# Patient Record
Sex: Male | Born: 1950 | Race: Black or African American | Hispanic: No | Marital: Single | Smoking: Never smoker
Health system: Southern US, Community
[De-identification: ages and names within clinical notes are randomized; demographics above are authoritative.]

## PROBLEM LIST (undated history)

## (undated) DIAGNOSIS — C801 Malignant (primary) neoplasm, unspecified: Secondary | ICD-10-CM

## (undated) DIAGNOSIS — Z7689 Persons encountering health services in other specified circumstances: Secondary | ICD-10-CM

## (undated) DIAGNOSIS — G4730 Sleep apnea, unspecified: Secondary | ICD-10-CM

## (undated) DIAGNOSIS — C61 Malignant neoplasm of prostate: Secondary | ICD-10-CM

## (undated) DIAGNOSIS — H269 Unspecified cataract: Secondary | ICD-10-CM

## (undated) HISTORY — DX: Malignant neoplasm of prostate: C61

## (undated) HISTORY — PX: OTHER SURGICAL HISTORY: SHX169

## (undated) HISTORY — DX: Unspecified cataract: H26.9

## (undated) HISTORY — DX: Persons encountering health services in other specified circumstances: Z76.89

---

## 2002-12-26 ENCOUNTER — Encounter (INDEPENDENT_AMBULATORY_CARE_PROVIDER_SITE_OTHER): Payer: Self-pay | Admitting: *Deleted

## 2002-12-26 ENCOUNTER — Ambulatory Visit (HOSPITAL_COMMUNITY): Admission: RE | Admit: 2002-12-26 | Discharge: 2002-12-26 | Payer: Self-pay | Admitting: Gastroenterology

## 2006-05-11 ENCOUNTER — Encounter: Admission: RE | Admit: 2006-05-11 | Discharge: 2006-05-11 | Payer: Self-pay | Admitting: Internal Medicine

## 2007-12-10 ENCOUNTER — Emergency Department (HOSPITAL_COMMUNITY): Admission: EM | Admit: 2007-12-10 | Discharge: 2007-12-10 | Payer: Self-pay | Admitting: Emergency Medicine

## 2009-01-22 ENCOUNTER — Encounter: Admission: RE | Admit: 2009-01-22 | Discharge: 2009-01-22 | Payer: Self-pay | Admitting: Sports Medicine

## 2010-07-22 NOTE — Op Note (Signed)
Tyrone Kline, Tyrone Kline                       ACCOUNT NO.:  [id number]   MEDICAL RECORD NO.:  [id number]                   PATIENT TYPE:  AMB   LOCATION:  ENDO                                 FACILITY:  [facility]   PHYSICIAN:  Anselmo Rod, M.D.               DATE OF BIRTH:  [birth year]   DATE OF PROCEDURE:  12/26/2002  DATE OF DISCHARGE:                                 OPERATIVE REPORT   PROCEDURE PERFORMED:  Colonoscopy with snare polypectomy x 3 and core biopsy  x 1.   ENDOSCOPIST:  Anselmo Rod, M.D.   INSTRUMENT USED:  Olympus video colonoscope.   INDICATION FOR PROCEDURE:  A 59 year old [place] American male with an  unknown family history undergoing screening colonoscopy to rule out colonic  polyps, masses, etc.   PREPROCEDURE PREPARATION:  Informed consent was procured from the patient.  The patient fasted for eight hours prior to the procedure and was prepped  with a bottle of magnesium citrate and a gallon of GoLYTELY the night prior  to the procedure.   PREPROCEDURE PHYSICAL EXAMINATION:  VITAL SIGNS:  Stable.  NECK:  Supple.  CHEST:  Clear to auscultation.  HEART:  S1 and S2 regular.  ABDOMEN:  Soft with normal bowel sounds.   DESCRIPTION OF PROCEDURE:  The patient was placed in the left lateral  decubitus position and sedated with 70 mg of Demerol and 7 mg of Versed in  slow incremental doses.  Once the patient was adequately sedated and  maintained on low-flow oxygen and continuous cardiac monitoring, the Olympus  video colonoscope was advanced from the rectum to the cecum and terminal  ileum.  The patient had a fairly good prep.  Three polyps were snared from  the right colon, one from the proximal right colon, one from the mid right  colon, and one from the distal right colon.  These were all placed in the  same bottle.  A small sessile polyp was biopsied from the proximal right  colon as well.  The appendicular orifices and ileocecal valve were clearly  visualized and photographed.  The terminal ileum appeared normal and without  lesions.  There were a few sigmoid diverticula seen.  Retroflexion in the  rectum revealed no abnormalities.  The patient tolerated the procedure well  without complications.   IMPRESSION:  1. Multiple right-sided colonic polyps removed by snare polypectomy and cold     biopsy (see description above).  2. Sigmoid diverticulosis.    RECOMMENDATIONS:  1. Await pathology results.  2. Avoid all nonsteroidals, including aspirin, for the next four weeks.  3. Outpatient followup in the next two weeks for further recommendations.  Anselmo Rod, M.D.    JNM/MEDQ  D:  12/26/2002  T:  12/26/2002  Job:  [id number]   cc:   Gabriel Earing, M.D.  [address]  [place]  [place] [address]  Fax: [phone]

## 2011-08-18 ENCOUNTER — Other Ambulatory Visit (HOSPITAL_COMMUNITY): Payer: Self-pay | Admitting: Urology

## 2011-08-18 DIAGNOSIS — C61 Malignant neoplasm of prostate: Secondary | ICD-10-CM

## 2011-08-29 ENCOUNTER — Encounter (HOSPITAL_COMMUNITY)
Admission: RE | Admit: 2011-08-29 | Discharge: 2011-08-29 | Disposition: A | Discharge status: Home / Self Care | Payer: 59 | Source: Ambulatory Visit | Attending: Urology | Admitting: Urology

## 2011-08-29 ENCOUNTER — Ambulatory Visit (HOSPITAL_COMMUNITY)
Admission: RE | Admit: 2011-08-29 | Discharge: 2011-08-29 | Disposition: A | Discharge status: Home / Self Care | Payer: 59 | Source: Ambulatory Visit | Attending: Urology | Admitting: Urology

## 2011-08-29 DIAGNOSIS — C61 Malignant neoplasm of prostate: Secondary | ICD-10-CM | POA: Insufficient documentation

## 2011-08-29 MED ORDER — TECHNETIUM TC 99M MEDRONATE IV KIT
25.0000 | PACK | Freq: Once | INTRAVENOUS | Status: AC | PRN
  Administered 2011-08-29: 25 via INTRAVENOUS

## 2011-09-04 ENCOUNTER — Other Ambulatory Visit (HOSPITAL_COMMUNITY): Payer: Self-pay | Admitting: Urology

## 2011-09-04 DIAGNOSIS — C61 Malignant neoplasm of prostate: Secondary | ICD-10-CM

## 2011-09-06 ENCOUNTER — Ambulatory Visit (HOSPITAL_COMMUNITY)
Admission: RE | Admit: 2011-09-06 | Discharge: 2011-09-06 | Disposition: A | Discharge status: Home / Self Care | Payer: 59 | Source: Ambulatory Visit | Attending: Urology | Admitting: Urology

## 2011-09-06 DIAGNOSIS — C61 Malignant neoplasm of prostate: Secondary | ICD-10-CM

## 2011-09-06 LAB — CREATININE, SERUM
Creatinine, Ser: 1.22 mg/dL (ref 0.50–1.35)
GFR calc Af Amer: 71 mL/min — ABNORMAL LOW (ref >90)
GFR calc non Af Amer: 61 mL/min — ABNORMAL LOW (ref >90)

## 2011-09-06 MED ORDER — GADOBENATE DIMEGLUMINE 529 MG/ML IV SOLN
20.0000 mL | Freq: Once | INTRAVENOUS | Status: AC | PRN
  Administered 2011-09-06: 20 mL via INTRAVENOUS

## 2011-09-27 ENCOUNTER — Other Ambulatory Visit: Payer: Self-pay | Admitting: Urology

## 2011-09-28 ENCOUNTER — Encounter (HOSPITAL_COMMUNITY): Payer: Self-pay | Admitting: Pharmacy Technician

## 2011-10-05 ENCOUNTER — Inpatient Hospital Stay (HOSPITAL_COMMUNITY): Admission: RE | Admit: 2011-10-05 | Discharge: 2011-10-05 | Payer: 59 | Source: Ambulatory Visit

## 2011-10-05 NOTE — Pre-Procedure Instructions (Signed)
Lab results from 05/23/2011 from Dr. Concepcion Elk on chart.

## 2011-10-06 ENCOUNTER — Encounter (HOSPITAL_COMMUNITY)
Admission: RE | Admit: 2011-10-06 | Discharge: 2011-10-06 | Disposition: A | Discharge status: Home / Self Care | Payer: 59 | Source: Ambulatory Visit | Attending: Urology | Admitting: Urology

## 2011-10-06 ENCOUNTER — Encounter (HOSPITAL_COMMUNITY): Payer: Self-pay

## 2011-10-06 DIAGNOSIS — G4730 Sleep apnea, unspecified: Secondary | ICD-10-CM

## 2011-10-06 HISTORY — DX: Sleep apnea, unspecified: G47.30

## 2011-10-06 HISTORY — DX: Malignant (primary) neoplasm, unspecified: C80.1

## 2011-10-06 LAB — CBC
HCT: 44.4 % (ref 39.0–52.0)
Hemoglobin: 15.2 g/dL (ref 13.0–17.0)
MCH: 30.8 pg (ref 26.0–34.0)
MCHC: 34.2 g/dL (ref 30.0–36.0)
MCV: 89.9 fL (ref 78.0–100.0)
Platelets: 176 10*3/uL (ref 150–400)
RBC: 4.94 MIL/uL (ref 4.22–5.81)
RDW: 13.8 % (ref 11.5–15.5)
WBC: 5.8 10*3/uL (ref 4.0–10.5)

## 2011-10-06 LAB — BASIC METABOLIC PANEL
BUN: 13 mg/dL (ref 6–23)
CO2: 29 mEq/L (ref 19–32)
Calcium: 9.4 mg/dL (ref 8.4–10.5)
Chloride: 105 mEq/L (ref 96–112)
Creatinine, Ser: 1.09 mg/dL (ref 0.50–1.35)
GFR calc Af Amer: 82 mL/min — ABNORMAL LOW (ref >90)
GFR calc non Af Amer: 70 mL/min — ABNORMAL LOW (ref >90)
Glucose, Bld: 86 mg/dL (ref 70–99)
Potassium: 3.9 mEq/L (ref 3.5–5.1)
Sodium: 140 mEq/L (ref 135–145)

## 2011-10-06 LAB — SURGICAL PCR SCREEN
MRSA, PCR: POSITIVE — AB
Staphylococcus aureus: POSITIVE — AB

## 2011-10-06 NOTE — Pre-Procedure Instructions (Signed)
CBC, BMET WERE DONE TODAY - PREOP AT [facility].  T/S TO BE DRAWN DAY OF SURGERY. PT STATED EKG WAS DONE THIS YEAR BY HIS MEDICAL DOCTOR-DR. AVBUERE.  REPORT CALLED FOR--OFFICE STAFF CONFIRMED THAT EKG WAS DONE IN [facility] 2013--REPORT WILL BE FAXED TO PRESURGICAL TESTING AT [facility]. CXR NOT NEEDED PER ANESTHESIOLOGIST'S GUIDELINES. PREOP INSTRUCTIONS DISCUSSED WITH PT USING TEACH BACK METHOD.

## 2011-10-06 NOTE — Patient Instructions (Signed)
YOUR SURGERY IS SCHEDULED ON:  WED  8/7  AT 8:30 AM  REPORT TO [facility] AT: 6:30 AM      PHONE # FOR SHORT STAY IS [phone]  FOLLOW CLEAR LIQUID DIET AND BOWEL PREP INSTRUCTIONS FROM DR. GRAPEY'S OFFICE - THE DAY BEFORE YOUR SURGERY.  DO NOT EAT OR DRINK ANYTHING AFTER MIDNIGHT THE NIGHT BEFORE YOUR SURGERY.  YOU MAY BRUSH YOUR TEETH, RINSE OUT YOUR MOUTH--BUT NO WATER, NO FOOD, NO CHEWING GUM, NO MINTS, NO CANDIES, NO CHEWING TOBACCO.  PLEASE TAKE THE FOLLOWING MEDICATIONS THE AM OF YOUR SURGERY WITH A FEW SIPS OF WATER:  NO MEDICINES TO TAKE    IF YOU USE INHALERS--USE YOUR INHALERS THE AM OF YOUR SURGERY AND BRING INHALERS TO THE HOSPITAL -TAKE TO SURGERY.    IF YOU ARE DIABETIC:  DO NOT TAKE ANY DIABETIC MEDICATIONS THE AM OF YOUR SURGERY.  IF YOU TAKE INSULIN IN THE EVENINGS--PLEASE ONLY TAKE 1/2 NORMAL EVENING DOSE THE NIGHT BEFORE YOUR SURGERY.  NO INSULIN THE AM OF YOUR SURGERY.  IF YOU HAVE SLEEP APNEA AND USE CPAP OR BIPAP--PLEASE BRING THE MASK --NOT THE MACHINE-NOT THE TUBING   -JUST THE MASK. DO NOT BRING VALUABLES, MONEY, CREDIT CARDS.  CONTACT LENS, DENTURES / PARTIALS, GLASSES SHOULD NOT BE WORN TO SURGERY AND IN MOST CASES-HEARING AIDS WILL NEED TO BE REMOVED.  BRING YOUR GLASSES CASE, ANY EQUIPMENT NEEDED FOR YOUR CONTACT LENS. FOR PATIENTS ADMITTED TO THE HOSPITAL--CHECK OUT TIME THE DAY OF DISCHARGE IS 11:00 AM.  ALL INPATIENT ROOMS ARE PRIVATE - WITH BATHROOM, TELEPHONE, TELEVISION AND WIFI INTERNET. IF YOU ARE BEING DISCHARGED THE SAME DAY OF YOUR SURGERY--YOU CAN NOT DRIVE YOURSELF HOME--AND SHOULD NOT GO HOME ALONE BY TAXI OR BUS.  NO DRIVING OR OPERATING MACHINERY FOR 24 HOURS FOLLOWING ANESTHESIA / PAIN MEDICATIONS.                            SPECIAL INSTRUCTIONS:  CHLORHEXIDINE SOAP SHOWER (other brand names are Betasept and Hibiclens ) PLEASE SHOWER WITH CHLORHEXIDINE THE NIGHT BEFORE YOUR SURGERY AND THE AM OF YOUR SURGERY. DO NOT USE  CHLORHEXIDINE ON YOUR FACE OR PRIVATE AREAS--YOU MAY USE YOUR NORMAL SOAP THOSE AREAS AND YOUR NORMAL SHAMPOO.  WOMEN SHOULD AVOID SHAVING UNDER ARMS AND SHAVING LEGS 48 HOURS BEFORE USING CHLORHEXIDINE TO AVOID SKIN IRRITATION.  DO NOT USE IF ALLERGIC TO CHLORHEXIDINE.  PLEASE READ OVER ANY  FACT SHEETS THAT YOU WERE GIVEN: MRSA INFORMATION, BLOOD TRANSFUSION INFORMATION

## 2011-10-10 NOTE — Pre-Procedure Instructions (Signed)
EKG REPORT FROM DR. AVBUERE'S OFFICE REQUESTED AGAIN --OFFICE AWARE PT'S SURGERY TOMORROW.  EKG IS NOT REQUIRED PREOP--PT DOES NOT HAVE ANY KNOWN CARDIOVASCULAR PROBLEMS--BUT ATTEMPTS MADE TO OBTAIN THE EKG FOR PT'S MEDICAL RECORD/SURGERY.  AS OF 4 PM THE EKG NOT FAXED-CHART TAKEN TO SHORT STAY CENTER.

## 2011-10-10 NOTE — H&P (Signed)
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 Again, I have reviewed his information along with his pathology and office notes from Dr. Brunilda Payor. The patient has high volume disease with 12 out of 12 positive biopsies. The majority of the cancer is Gleason 7 but some primary Gleason 4 component. MRI did not show any definitive evidence of disease outside the prostate but obviously microscopically it is likely. Based on the [facility] Tables, his chance of organ confined disease is about 1 out of 3. He has about a 70+ percent chance of having extracapsular extension with a 40% chance of seminal vesicle involvement and a 6% chance of having positive lymph nodes. The patient's preoperative SHIM score is 15/25. He is a candidate only for wide excision bilaterally without any nerve-spare. He has had no intraabdominal surgery.      History of Present Illness     Past Hx from Dr. Brunilda Payor:   Mr Tyrone Kline's biopsy is positive for adenocarcinoma of prostate Gleason 4+3, 3+4 and 3+3.  All cores are positive in 30 to 90% of the cores.  His PSA is 10.49.  I spenttwenty five minutes in discussion with Mr Tyrone Kline and his wife.  They understand that his prognosis depends on the Gleason score, the PSA and the volume of the disease.  He has high volume disease and Gleason 4+3.  His PSA is 10.49.  I think he has high risk disease.  It is likely that he has extraprostatic disease.  According to the [facility] table he has 7% chance of having organ confined disease, 43% risk of extraprostatic extension, 10% of seminal vesicle involvement and 38% risks of lymph node involvement.  I discussed the treatment options: active surveillance, Radical prostatectomy, IMRT, brachytherapy, HIFU, cryoablation.  The risks, benefits of each option were discussed in detail.  I advised him againsyt active surveillance.  H eneeds bone scan  to rule bony metastasis.  If bone scan is negative will request CT scan or MRI to rule out pelvic or retroperitoneal adenopathy.       Mr Desire returns to the office today with his wife for bone scan and MRI results and discuss further management of his prostate cancer.  Bone scan showed no evidence of metastatic disease.  MRI shows 2 small right obturator and 1 small left obturator nodes. The ones on the right measure less than 1 cm, the left one measures 14 mm.  I went again over the treatment options with Mr and Mrs Tyrone Kline.  The risks, benefits of each option were explained in detail.  Mr Tyrone Kline states that he is interested in radical prostatectomy.  He understands that there is a risk of lymph node involvement.  I answered all their questions to their satisfaction.  I will ask one of my robotic partners to see him in consultation.   Past Medical History Problems  1. History of  Glaucoma 365.9  Surgical History Problems  1. History of  Cataract Surgery Bilateral 2. History of  Hand Surgery Right  Current Meds 1. Fish Oil CAPS; Therapy: (Recorded:23Jul2013) to 2. Levitra 20 MG Oral Tablet; Therapy: 12Apr2013 to 3. Multi-Vitamin TABS; Therapy: (Recorded:02May2013) to 4. Vitamin B-12 TABS; Therapy: (Recorded:23Jul2013) to 5. Vitamin C TABS; Therapy: (Recorded:23Jul2013) to 6. Vitamin D TABS; Therapy: (Recorded:23Jul2013) to  Allergies Medication  1. No Known Drug Allergies  Family History Problems  1. Sororal history of  Diabetes Mellitus  V18.0 2. Fraternal history of  Father Deceased At Age ____ 3. Family history of  Heart Disease V17.49 4. Sororal history of  Hypertension V17.49 5. Family history of  Kidney Cancer V16.51 6. Fraternal history of  Mother Deceased At Age ____ 7. Fraternal history of  Nephrolithiasis 8. Fraternal history of  Renal Failure  Social History Problems  1. Caffeine Use few per month 2. Marital History - Currently Married 3. 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 History of  Alcohol Use 6. History of  Tobacco Use  Review of Systems Genitourinary, constitutional, skin, eye, otolaryngeal, hematologic/lymphatic, cardiovascular, pulmonary, endocrine, musculoskeletal, gastrointestinal, neurological and psychiatric system(s) were reviewed and pertinent findings if present are noted.  Genitourinary: erectile dysfunction.  Psychiatric: depression.    Vitals Vital Signs [Data Includes: Last 1 Day]  23Jul2013 03:27PM  Blood Pressure: 130 / 77 Temperature: 97 F Heart Rate: 95  Physical Exam Constitutional: Well nourished and well developed . No acute distress.  ENT:. The ears and nose are normal in appearance.  Neck: The appearance of the neck is normal and no neck mass is present.  Pulmonary: No respiratory distress and normal respiratory rhythm and effort.  Cardiovascular: Heart rate and rhythm are normal . No peripheral edema.  Abdomen: The abdomen is soft and nontender. No masses are palpated. No CVA tenderness. No hernias are palpable. No hepatosplenomegaly noted.  Genitourinary: Examination of the penis demonstrates no discharge, no masses, no lesions and a normal meatus. The scrotum is without lesions. The right epididymis is palpably normal and non-tender. The left epididymis is palpably normal and non-tender. The right testis is non-tender and without masses. The left testis is non-tender and without masses.  Skin: Normal skin turgor, no visible rash and no visible skin lesions.    Assessment Assessed  1. Adenocarcinoma Of The Prostate Gland 185  Discussion/Summary  A total of 50 minutes were spent in the overall care of the patient today with 50 minutes in direct face to face consultation.   The patient was counseled about the natural history of prostate cancer and the standard treatment options that are available for prostate cancer. It was explained to him how his age and life expectancy, clinical stage,  Gleason score, and PSA affect his prognosis, the decision to proceed with additional staging studies, as well as how that information influences recommended treatment strategies. We discussed the roles for active surveillance, radiation therapy, surgical therapy, androgen deprivation, as well as ablative therapy options for the treatment of prostate cancer as appropriate to his individual cancer situation. We discussed the risks and benefits of these options with regard to their impact on cancer control and also in terms of potential adverse events, complications, and impact on quiality of life particularly related to urinary, bowel, and sexual function. The patient was encouraged to ask questions throughout the discussion today and all questions were answered to his stated satisfaction. In addition, the patient was provided with and/or directed to appropriate resources and literature for further education about prostate cancer and treatment options.   We discussed surgical therapy for prostate cancer including the different available surgical approaches. We discussed, in detail, the risks and expectations of surgery with regard to cancer control, urinary control, and erectile function as well as the expected postoperative recovery process. The risks, potential complications/adverse events of radical prostatectomy as well as alternative options were explained to the patient.   We discussed surgical therapy for prostate cancer including the different available surgical  approaches. We discussed, in detail, the risks and expectations of surgery with regard to cancer control, urinary control, and erectile function as well as the expected postoperative recovery process. Additional risks of surgery including but not limited to bleeding, infection, hernia formation, nerve damage, lymphocele formation, bowel/rectal injury potentially necessitating colostomy, damage to the urinary tract resulting in urine leakage, urethral  stricture, and the cardiopulmonary risks such as myocardial infarction, stroke, death, venothromboembolism, etc. were explained. The risk of open surgical conversion for robotic/laparoscopic prostatectomy was also discussed.    Amendment  Ultimately I do think Mr. Helmuth would be best served with a surgical approach. I think this would provide much more information with regard to pathologic staging. He and his wife understand that it is highly unlikely that this procedure itself will be curative, although there is potentially a 30% chance that we may indeed find only organ confined disease and no other therapy may be necessary. If he does have evidence of significant extension beyond the prostate, then radiation will need to be considered after an appropriate healing time. If indeed he does have metastatic disease, then certainly hormonal therapy will need to be initiated. He understands that he should have wide excision of the neurovascular bundles and will have erectile dysfunction. He could be potentially managed with penile injection therapy, vacuum device or potentially implant. He will not generally have much of a chance in responding to oral agents. We talked about incontinence issues. Again, I do think this is the best way to proceed. We will attempt to try to get him on the schedule for sometime in the next few weeks. It has now been 6-8 weeks since his biopsy.   CC: Su Grand, MD  Fleet Contras, MD      Signatures Electronically signed by : Barron Alvine, M.D.; Sep 27 2011 12:40PM

## 2011-10-11 ENCOUNTER — Encounter (HOSPITAL_COMMUNITY)
Admission: RE | Disposition: A | Discharge status: Home / Self Care | Payer: Self-pay | Source: Ambulatory Visit | Attending: Urology

## 2011-10-11 ENCOUNTER — Ambulatory Visit (HOSPITAL_COMMUNITY): Payer: 59 | Admitting: Anesthesiology

## 2011-10-11 ENCOUNTER — Encounter (HOSPITAL_COMMUNITY): Payer: Self-pay | Admitting: *Deleted

## 2011-10-11 ENCOUNTER — Encounter (HOSPITAL_COMMUNITY): Payer: Self-pay | Admitting: Anesthesiology

## 2011-10-11 ENCOUNTER — Inpatient Hospital Stay (HOSPITAL_COMMUNITY)
Admission: RE | Admit: 2011-10-11 | Discharge: 2011-10-12 | DRG: 708 | Disposition: A | Discharge status: Home / Self Care | Payer: 59 | Source: Ambulatory Visit | Attending: Urology | Admitting: Urology

## 2011-10-11 DIAGNOSIS — H409 Unspecified glaucoma: Secondary | ICD-10-CM | POA: Diagnosis present

## 2011-10-11 DIAGNOSIS — C61 Malignant neoplasm of prostate: Principal | ICD-10-CM | POA: Diagnosis present

## 2011-10-11 HISTORY — PX: ROBOT ASSISTED LAPAROSCOPIC RADICAL PROSTATECTOMY: SHX5141

## 2011-10-11 LAB — ABO/RH: ABO/RH(D): O POS

## 2011-10-11 LAB — TYPE AND SCREEN
ABO/RH(D): O POS
Antibody Screen: NEGATIVE

## 2011-10-11 LAB — HEMOGLOBIN AND HEMATOCRIT, BLOOD
HCT: 44.5 % (ref 39.0–52.0)
Hemoglobin: 15 g/dL (ref 13.0–17.0)

## 2011-10-11 LAB — GLUCOSE, CAPILLARY: Glucose-Capillary: 187 mg/dL — ABNORMAL HIGH (ref 70–99)

## 2011-10-11 SURGERY — ROBOTIC ASSISTED LAPAROSCOPIC RADICAL PROSTATECTOMY
Anesthesia: General | Wound class: Clean Contaminated

## 2011-10-11 MED ORDER — ACETAMINOPHEN 10 MG/ML IV SOLN
1000.0000 mg | Freq: Four times a day (QID) | INTRAVENOUS | Status: AC
  Administered 2011-10-11 – 2011-10-12 (×4): 1000 mg via INTRAVENOUS
  Filled 2011-10-11 (×5): qty 100

## 2011-10-11 MED ORDER — EPHEDRINE SULFATE 50 MG/ML IJ SOLN
INTRAMUSCULAR | Status: DC | PRN
  Administered 2011-10-11: 5 mg via INTRAVENOUS
  Administered 2011-10-11: 10 mg via INTRAVENOUS

## 2011-10-11 MED ORDER — ONDANSETRON HCL 4 MG/2ML IJ SOLN
INTRAMUSCULAR | Status: DC | PRN
  Administered 2011-10-11 (×2): 2 mg via INTRAVENOUS

## 2011-10-11 MED ORDER — GLYCOPYRROLATE 0.2 MG/ML IJ SOLN
INTRAMUSCULAR | Status: DC | PRN
  Administered 2011-10-11: 0.4 mg via INTRAVENOUS

## 2011-10-11 MED ORDER — HYDROMORPHONE HCL PF 1 MG/ML IJ SOLN
INTRAMUSCULAR | Status: DC | PRN
  Administered 2011-10-11 (×4): 0.5 mg via INTRAVENOUS

## 2011-10-11 MED ORDER — HYDROMORPHONE HCL PF 1 MG/ML IJ SOLN: 0.2500 mg | INTRAMUSCULAR | Status: DC | PRN

## 2011-10-11 MED ORDER — NEOSTIGMINE METHYLSULFATE 1 MG/ML IJ SOLN
INTRAMUSCULAR | Status: DC | PRN
  Administered 2011-10-11: 3 mg via INTRAVENOUS

## 2011-10-11 MED ORDER — CIPROFLOXACIN HCL 500 MG PO TABS: 500.0000 mg | ORAL_TABLET | Freq: Two times a day (BID) | ORAL | Status: AC

## 2011-10-11 MED ORDER — BUPIVACAINE-EPINEPHRINE 0.25% -1:200000 IJ SOLN
INTRAMUSCULAR | Status: DC | PRN
  Administered 2011-10-11: 30 mL

## 2011-10-11 MED ORDER — HEPARIN SODIUM (PORCINE) 1000 UNIT/ML IJ SOLN
INTRAMUSCULAR | Status: AC
  Filled 2011-10-11: qty 1

## 2011-10-11 MED ORDER — ACETAMINOPHEN 10 MG/ML IV SOLN
INTRAVENOUS | Status: AC
  Filled 2011-10-11: qty 100

## 2011-10-11 MED ORDER — INDIGOTINDISULFONATE SODIUM 8 MG/ML IJ SOLN
INTRAMUSCULAR | Status: DC | PRN
  Administered 2011-10-11 (×2): 5 mL via INTRAVENOUS

## 2011-10-11 MED ORDER — INDIGOTINDISULFONATE SODIUM 8 MG/ML IJ SOLN
INTRAMUSCULAR | Status: AC
  Filled 2011-10-11: qty 10

## 2011-10-11 MED ORDER — SODIUM CHLORIDE 0.9 % IR SOLN
Status: DC | PRN
  Administered 2011-10-11: 1000 mL via INTRAVESICAL

## 2011-10-11 MED ORDER — STERILE WATER FOR IRRIGATION IR SOLN
Status: DC | PRN
  Administered 2011-10-11: 3000 mL

## 2011-10-11 MED ORDER — LACTATED RINGERS IV SOLN
INTRAVENOUS | Status: DC | PRN
  Administered 2011-10-11: 09:00:00

## 2011-10-11 MED ORDER — SUCCINYLCHOLINE CHLORIDE 20 MG/ML IJ SOLN
INTRAMUSCULAR | Status: DC | PRN
  Administered 2011-10-11: 160 mg via INTRAVENOUS

## 2011-10-11 MED ORDER — LACTATED RINGERS IV SOLN
INTRAVENOUS | Status: DC | PRN
  Administered 2011-10-11: 12:00:00 via INTRAVENOUS

## 2011-10-11 MED ORDER — FENTANYL CITRATE 0.05 MG/ML IJ SOLN
INTRAMUSCULAR | Status: DC | PRN
  Administered 2011-10-11 (×3): 50 ug via INTRAVENOUS
  Administered 2011-10-11: 100 ug via INTRAVENOUS

## 2011-10-11 MED ORDER — HYDROCODONE-ACETAMINOPHEN 5-325 MG PO TABS: 1.0000 | ORAL_TABLET | ORAL | Status: DC | PRN

## 2011-10-11 MED ORDER — KETAMINE HCL 10 MG/ML IJ SOLN
INTRAMUSCULAR | Status: DC | PRN
  Administered 2011-10-11 (×4): 5 mg via INTRAVENOUS

## 2011-10-11 MED ORDER — SODIUM CHLORIDE 0.9 % IV SOLN
INTRAVENOUS | Status: DC | PRN
  Administered 2011-10-11 (×2): via INTRAVENOUS

## 2011-10-11 MED ORDER — ONDANSETRON HCL 4 MG/2ML IJ SOLN
INTRAMUSCULAR | Status: AC
  Administered 2011-10-11: 4 mg
  Filled 2011-10-11: qty 2

## 2011-10-11 MED ORDER — LIDOCAINE HCL (CARDIAC) 20 MG/ML IV SOLN
INTRAVENOUS | Status: DC | PRN
  Administered 2011-10-11: 20 mg via INTRAVENOUS

## 2011-10-11 MED ORDER — SODIUM CHLORIDE 0.9 % IV SOLN
INTRAVENOUS | Status: AC
  Filled 2011-10-11: qty 1.5

## 2011-10-11 MED ORDER — PROPOFOL 10 MG/ML IV EMUL
INTRAVENOUS | Status: DC | PRN
  Administered 2011-10-11: 200 mg via INTRAVENOUS

## 2011-10-11 MED ORDER — MORPHINE SULFATE 2 MG/ML IJ SOLN
2.0000 mg | INTRAMUSCULAR | Status: DC | PRN
  Administered 2011-10-12 (×2): 2 mg via INTRAVENOUS
  Filled 2011-10-11 (×2): qty 1

## 2011-10-11 MED ORDER — HYDROCODONE-ACETAMINOPHEN 5-325 MG PO TABS: 1.0000 | ORAL_TABLET | Freq: Four times a day (QID) | ORAL | Status: AC | PRN

## 2011-10-11 MED ORDER — DEXTROSE-NACL 5-0.45 % IV SOLN
INTRAVENOUS | Status: DC
  Administered 2011-10-11 – 2011-10-12 (×3): via INTRAVENOUS

## 2011-10-11 MED ORDER — SODIUM CHLORIDE 0.9 % IV SOLN
1.5000 g | Freq: Four times a day (QID) | INTRAVENOUS | Status: AC
  Administered 2011-10-11 – 2011-10-12 (×3): 1.5 g via INTRAVENOUS
  Filled 2011-10-11 (×3): qty 1.5

## 2011-10-11 MED ORDER — BUPIVACAINE-EPINEPHRINE PF 0.25-1:200000 % IJ SOLN
INTRAMUSCULAR | Status: AC
  Filled 2011-10-11: qty 30

## 2011-10-11 MED ORDER — SODIUM CHLORIDE 0.9 % IV BOLUS (SEPSIS)
1000.0000 mL | Freq: Once | INTRAVENOUS | Status: AC
  Administered 2011-10-11: 1000 mL via INTRAVENOUS

## 2011-10-11 MED ORDER — ACETAMINOPHEN 10 MG/ML IV SOLN
INTRAVENOUS | Status: DC | PRN
  Administered 2011-10-11: 1000 mg via INTRAVENOUS

## 2011-10-11 MED ORDER — LACTATED RINGERS IV SOLN: INTRAVENOUS | Status: DC | PRN

## 2011-10-11 MED ORDER — PROMETHAZINE HCL 25 MG/ML IJ SOLN
12.5000 mg | Freq: Four times a day (QID) | INTRAMUSCULAR | Status: DC | PRN
  Administered 2011-10-11: 12.5 mg via INTRAVENOUS
  Filled 2011-10-11: qty 1

## 2011-10-11 MED ORDER — SODIUM CHLORIDE 0.9 % IV SOLN
1.5000 g | INTRAVENOUS | Status: AC
  Administered 2011-10-11: 1.5 g via INTRAVENOUS

## 2011-10-11 MED ORDER — ONDANSETRON HCL 4 MG/2ML IJ SOLN: 4.0000 mg | Freq: Three times a day (TID) | INTRAMUSCULAR | Status: DC | PRN

## 2011-10-11 MED ORDER — MIDAZOLAM HCL 5 MG/5ML IJ SOLN
INTRAMUSCULAR | Status: DC | PRN
  Administered 2011-10-11: 2 mg via INTRAVENOUS

## 2011-10-11 MED ORDER — CISATRACURIUM BESYLATE (PF) 10 MG/5ML IV SOLN
INTRAVENOUS | Status: DC | PRN
  Administered 2011-10-11 (×2): 1 mg via INTRAVENOUS
  Administered 2011-10-11: 4 mg via INTRAVENOUS
  Administered 2011-10-11: 2 mg via INTRAVENOUS
  Administered 2011-10-11: 10 mg via INTRAVENOUS
  Administered 2011-10-11: 2 mg via INTRAVENOUS

## 2011-10-11 SURGICAL SUPPLY — 44 items
CANISTER SUCTION 2500CC (MISCELLANEOUS) ×3 IMPLANT
CATH FOLEY 2WAY SLVR  5CC 20FR (CATHETERS) ×1
CATH FOLEY 2WAY SLVR 5CC 20FR (CATHETERS) ×2 IMPLANT
CATH ROBINSON RED A/P 8FR (CATHETERS) ×3 IMPLANT
CHLORAPREP W/TINT 26ML (MISCELLANEOUS) ×3 IMPLANT
CLIP LIGATING HEM O LOK PURPLE (MISCELLANEOUS) ×8 IMPLANT
CLOTH BEACON ORANGE TIMEOUT ST (SAFETY) ×3 IMPLANT
CORD HIGH FREQUENCY UNIPOLAR (ELECTROSURGICAL) ×3 IMPLANT
COVER SURGICAL LIGHT HANDLE (MISCELLANEOUS) ×3 IMPLANT
COVER TIP SHEARS 8 DVNC (MISCELLANEOUS) ×2 IMPLANT
COVER TIP SHEARS 8MM DA VINCI (MISCELLANEOUS) ×1
CUTTER ECHEON FLEX ENDO 45 340 (ENDOMECHANICALS) ×3 IMPLANT
DECANTER SPIKE VIAL GLASS SM (MISCELLANEOUS) ×3 IMPLANT
DRAPE SURG IRRIG POUCH 19X23 (DRAPES) ×3 IMPLANT
DRAPE UTILITY 15X26 (DRAPE) ×3 IMPLANT
DRSG TEGADERM 6X8 (GAUZE/BANDAGES/DRESSINGS) ×9 IMPLANT
ELECT REM PT RETURN 9FT ADLT (ELECTROSURGICAL) ×3
ELECTRODE REM PT RTRN 9FT ADLT (ELECTROSURGICAL) ×2 IMPLANT
GLOVE BIO SURGEON STRL SZ 6.5 (GLOVE) ×6 IMPLANT
GLOVE BIOGEL M STRL SZ7.5 (GLOVE) ×3 IMPLANT
GOWN PREVENTION PLUS XLARGE (GOWN DISPOSABLE) ×3 IMPLANT
GOWN STRL NON-REIN LRG LVL3 (GOWN DISPOSABLE) ×3 IMPLANT
GOWN STRL REIN XL XLG (GOWN DISPOSABLE) ×3 IMPLANT
HEMOSTAT SURGICEL 4X8 (HEMOSTASIS) ×1 IMPLANT
HOLDER FOLEY CATH W/STRAP (MISCELLANEOUS) ×3 IMPLANT
IV LACTATED RINGERS 1000ML (IV SOLUTION) ×2 IMPLANT
KIT ACCESSORY DA VINCI DISP (KITS) ×1
KIT ACCESSORY DVNC DISP (KITS) ×2 IMPLANT
NDL SAFETY ECLIPSE 18X1.5 (NEEDLE) ×2 IMPLANT
NEEDLE HYPO 18GX1.5 SHARP (NEEDLE) ×3
PACK ROBOT UROLOGY CUSTOM (CUSTOM PROCEDURE TRAY) ×3 IMPLANT
RELOAD GREEN ECHELON 45 (STAPLE) ×3 IMPLANT
SEALER TISSUE G2 CVD JAW 45CM (ENDOMECHANICALS) IMPLANT
SET TUBE IRRIG SUCTION NO TIP (IRRIGATION / IRRIGATOR) ×3 IMPLANT
SOLUTION ELECTROLUBE (MISCELLANEOUS) ×3 IMPLANT
SPONGE GAUZE 4X4 12PLY (GAUZE/BANDAGES/DRESSINGS) ×3 IMPLANT
SUT VIC AB 0 CT1 27 (SUTURE) ×3
SUT VIC AB 0 CT1 27XBRD ANTBC (SUTURE) IMPLANT
SUT VIC AB 2-0 SH 27 (SUTURE) ×12
SUT VIC AB 2-0 SH 27X BRD (SUTURE) ×2 IMPLANT
SUT VICRYL 0 UR6 27IN ABS (SUTURE) ×5 IMPLANT
SYR 27GX1/2 1ML LL SAFETY (SYRINGE) ×3 IMPLANT
TOWEL OR NON WOVEN STRL DISP B (DISPOSABLE) ×3 IMPLANT
WATER STERILE IRR 1500ML POUR (IV SOLUTION) ×4 IMPLANT

## 2011-10-11 NOTE — Progress Notes (Signed)
Day of Surgery Subjective: Patient reports pain control good. Sleepy.  Denies N/V.  Objective: Vital signs in last 24 hours: Temp:  [97.3 F (36.3 C)-98.1 F (36.7 C)] 97.3 F (36.3 C) (08/07 1327) Pulse Rate:  [73-94] 79  (08/07 1327) Resp:  [10-18] 14  (08/07 1327) BP: (134-156)/(81-92) 141/87 mmHg (08/07 1327) SpO2:  [98 %-100 %] 98 % (08/07 1327) Weight:  [101.061 kg (222 lb 12.8 oz)] 101.061 kg (222 lb 12.8 oz) (08/07 1327)  Intake/Output from previous day:   Intake/Output this shift: Total I/O In: 2950 [I.V.:1950; IV Piggyback:1000] Out: 515 [Urine:275; Drains:90; Blood:150]  Physical Exam:  General:cooperative and no distress Cardiovascular: RRR  GI: soft Incisions: dressings C.D/I Urine: light blue Extremities: warm  Lab Results:  Basename 10/11/11 1225  HGB 15.0  HCT 44.5   BMET No results found for this basename: NA:2,K:2,CL:2,CO2:2,GLUCOSE:2,BUN:2,CREATININE:2,CALCIUM:2 in the last 72 hours No results found for this basename: LABPT:3,INR:3 in the last 72 hours No results found for this basename: LABURIN:1 in the last 72 hours Results for orders placed during the hospital encounter of 10/06/11  SURGICAL PCR SCREEN     Status: Abnormal   Collection Time   10/06/11 11:40 AM      Component Value Range Status Comment   MRSA, PCR POSITIVE (*) NEGATIVE Final    Staphylococcus aureus POSITIVE (*) NEGATIVE Final     Studies/Results: No results found.  Assessment/Plan: Day of Surgery, Procedure(s) (LRB): ROBOTIC ASSISTED LAPAROSCOPIC RADICAL PROSTATECTOMY (N/A) LYMPHADENECTOMY (Bilateral)  Doing well.  Continue to monitor Ambulate, Incentive spirometry DVT prophylaxis   LOS: 0 days   Tyrone Kline. 10/11/2011, 3:46 PM

## 2011-10-11 NOTE — Transfer of Care (Signed)
Immediate Anesthesia Transfer of Care Note  Patient: Tyrone Kline  Procedure(s) Performed: Procedure(s) (LRB): ROBOTIC ASSISTED LAPAROSCOPIC RADICAL PROSTATECTOMY (N/A) LYMPHADENECTOMY (Bilateral)  Patient Location: PACU  Anesthesia Type: General  Level of Consciousness: awake, alert , oriented and patient cooperative  Airway & Oxygen Therapy: Patient Spontanous Breathing and Patient connected to face mask oxygen  Post-op Assessment: Report given to PACU RN, Post -op Vital signs reviewed and stable and Patient moving all extremities X 4  Post vital signs: stable  Complications: No apparent anesthesia complications

## 2011-10-11 NOTE — Interval H&P Note (Signed)
History and Physical Interval Note:  10/11/2011 8:13 AM  Tyrone Kline  has presented today for surgery, with the diagnosis of PROSTATE CANCER  The various methods of treatment have been discussed with the patient and family. After consideration of risks, benefits and other options for treatment, the patient has consented to  Procedure(s) (LRB): ROBOTIC ASSISTED LAPAROSCOPIC RADICAL PROSTATECTOMY (N/A) LYMPHADENECTOMY (Bilateral) as a surgical intervention .  The patient's history has been reviewed, patient examined, no change in status, stable for surgery.  I have reviewed the patient's chart and labs.  Questions were answered to the patient's satisfaction.     Nobel Brar S

## 2011-10-11 NOTE — Op Note (Signed)
Preoperative diagnosis: Clinical stage T1c high risk2 Adenocarcinoma prostate Postoperative diagnosis: Same  Procedure: Robotic-assisted laparoscopic radical retropubic prostatectomy with bilateral pelvic lymph node dissection  Surgeon: Valetta Fuller, MD  Asst.: Pecola Leisure, PA Anesthesia: Gen. Endotracheal  Indications: Patient was diagnosed with clinical stage TIc Adenocarcinoma the prostate. He underwent extensive consultation with regard to treatment options. The patient decided on a surgical approach. He appeared to understand the distinct advantages as well as the disadvantages of this procedure. The patient has performed a mechanical bowel prep. He has had placement of PAS compression boots and has received perioperative antibiotics. The patient's preoperative PSA was 10.5. Ultrasound revealed a 30 g prostate.  Full cancer history in history/ physical. Technique and findings:The patient was brought to the operating room and had successful induction of general endotracheal anesthesia.the patient was placed in a low lithotomy position with careful padding of all extremities. He was secured to the operative table and placed in the steep Trendelenburg position. He was prepped and draped in usual manner. A Foley catheter was placed sterilely on the field. Camera port site was chosen 18 cm above the pubic symphysis just to the left of the umbilicus. A standard open Hassan technique was utilized. A 12 mm trocar was placed without difficulty. The camera was then inserted and no abnormalities were noted within the pelvis. The trochars were placed with direct visual guidance. This included 3 8mm robotic trochars and a 12 mm and 5 mm assist ports. Once all the ports were placed the robot was docked. The bladder was filled and the space of Retzius was developed with electrocautery dissection as well as blunt dissection. Superficial fat off the endopelvic fascia and bladder neck was removed with  electrocautery scissors. The endopelvic fascia was then incised bilaterally from base to apex. Levator musculature was swept off the apex of the prostate isolating the dorsal venous complex which was then stapled with the ETS stapling device. The anterior bladder neck was identified with the aid of the Foley balloon. This was then transected down to the Foley catheter with electrocautery scissors. The Foley catheter was then retracted anteriorly. Indigo carmine was given and we appeared to be well away from the ureteral orifices. The posterior bladder neck was then transected and the dissection carried down to the adnexal structures. The seminal vesicles and vas deferens on both sides were then individually dissected free and retracted anteriorly. The posterior plane between the rectum and prostate was then established primarily with blunt dissection.  The patient was not felt to be a candidate for nerve sparing procedure. For that reason we did wide excision of the neurovascular bundles bilaterally.  With the prostate retracted anteriorly the vascular pedicles of the prostate were taken with the Enseal device. The Foley catheter was then reinserted and the anterior urethra was transected. The posterior urethra was then transected as were some rectourethralis fibers. The prostate was then removed from the pelvis. The pelvis was then copiously irrigated. Rectal insufflation was performed and there was no evidence of rectal injury.  Attention was then turned towards bilateral pelvic lymph node dissection. The obturator node packets were removed I laterally and the dissection extended towards the bifurcation of the iliac artery. The obturator nerve was identified on both sides and preserved. Hemalock clips were used for small veins and lymphatic channels. The node packets were sent for permanent analysis.  Attention was then turned towards reconstruction. The bladder neck did did require some lateral  reconstruction. Figure-of-eight sutures were performed  at the 3 and 9:00 position to reduce the size of the bladder neck. We did elect to take a larger margin the bladder neck given the patient's very large volume cancer.. The bladder neck and posterior urethra were reapproximated at the 6:00 position utilizing a 2-0 Vicryl suture. The rest of the anastomosis was done with a double-armed 3-0 Monocryl suture in a 360 degree manner. Additional indigo carmine was given. A new catheter was placed and bladder irrigation revealed no evidence of leakage. A Blake drain was placed through one of the robotic trochars and positioned in the retropubic space above the anastomosis. This was then secured to the skin with a nylon suture. The prostate was placed in the Endopouch retrieval bag. The 12 mm trocar site was closed with a Vicryl suture with the aid of a suture passer. Our other trochars were taken out with direct visual guidance without evidence of any bleeding. The camera port incision was extended slightly to allow for removal of the specimen and then closed with a running Vicryl suture. All port sites were infiltrated with Marcaine and then closed with surgical clips. The patient was then taken to recovery room having had no obvious complications or problems. Sponge and needle counts were correct.

## 2011-10-11 NOTE — Anesthesia Preprocedure Evaluation (Signed)
Anesthesia Evaluation  Patient identified by MRN, date of birth, ID band Patient awake    Reviewed: Allergy & Precautions, H&P , NPO status , Patient's Chart, lab work & pertinent test results, reviewed documented beta blocker date and time   Airway Mallampati: II TM Distance: >3 FB Neck ROM: Full    Dental  (+) Upper Dentures and Dental Advisory Given   Pulmonary neg pulmonary ROS,  breath sounds clear to auscultation        Cardiovascular negative cardio ROS  Rhythm:Regular Rate:Normal  Denies cardiac symptoms   Neuro/Psych negative neurological ROS  negative psych ROS   GI/Hepatic negative GI ROS, Neg liver ROS,   Endo/Other  negative endocrine ROS  Renal/GU negative Renal ROS   Prostate cancer    Musculoskeletal negative musculoskeletal ROS (+)   Abdominal   Peds negative pediatric ROS (+)  Hematology negative hematology ROS (+)   Anesthesia Other Findings   Reproductive/Obstetrics negative OB ROS                           Anesthesia Physical Anesthesia Plan  ASA: II  Anesthesia Plan: General   Post-op Pain Management:    Induction: Intravenous  Airway Management Planned: Oral ETT  Additional Equipment:   Intra-op Plan:   Post-operative Plan: Extubation in OR  Informed Consent: I have reviewed the patients History and Physical, chart, labs and discussed the procedure including the risks, benefits and alternatives for the proposed anesthesia with the patient or authorized representative who has indicated his/her understanding and acceptance.   Dental advisory given  Plan Discussed with: CRNA and Surgeon  Anesthesia Plan Comments:         Anesthesia Quick Evaluation

## 2011-10-11 NOTE — Anesthesia Postprocedure Evaluation (Signed)
  Anesthesia Post-op Note  Patient: Tyrone Kline  Procedure(s) Performed: Procedure(s) (LRB): ROBOTIC ASSISTED LAPAROSCOPIC RADICAL PROSTATECTOMY (N/A) LYMPHADENECTOMY (Bilateral)  Patient Location: PACU  Anesthesia Type: General  Level of Consciousness: oriented and sedated  Airway and Oxygen Therapy: Patient Spontanous Breathing and Patient connected to nasal cannula oxygen  Post-op Pain: mild  Post-op Assessment: Post-op Vital signs reviewed, Patient's Cardiovascular Status Stable, Respiratory Function Stable and Patent Airway  Post-op Vital Signs: stable  Complications: No apparent anesthesia complications

## 2011-10-11 NOTE — Progress Notes (Signed)
Patient having great deal of nausea. Has never had anesthesia prior to today. Encouraged patient to ambulate this evening but patient just doesn't feel very well. Will try again later this evening/night. Ginny Forth

## 2011-10-12 ENCOUNTER — Encounter (HOSPITAL_COMMUNITY): Payer: Self-pay | Admitting: Urology

## 2011-10-12 LAB — BASIC METABOLIC PANEL
BUN: 7 mg/dL (ref 6–23)
CO2: 28 mEq/L (ref 19–32)
Calcium: 8.4 mg/dL (ref 8.4–10.5)
Chloride: 107 mEq/L (ref 96–112)
Creatinine, Ser: 1.04 mg/dL (ref 0.50–1.35)
GFR calc Af Amer: 86 mL/min — ABNORMAL LOW (ref >90)
GFR calc non Af Amer: 74 mL/min — ABNORMAL LOW (ref >90)
Glucose, Bld: 107 mg/dL — ABNORMAL HIGH (ref 70–99)
Potassium: 3.9 mEq/L (ref 3.5–5.1)
Sodium: 142 mEq/L (ref 135–145)

## 2011-10-12 LAB — HEMOGLOBIN AND HEMATOCRIT, BLOOD
HCT: 42.1 % (ref 39.0–52.0)
Hemoglobin: 14.6 g/dL (ref 13.0–17.0)

## 2011-10-12 NOTE — Progress Notes (Signed)
Patient finally ambulated approximately 30 feet into hall and then 30 feet back to bed. Very slow and guarded in movements. Patient informed that the more he gets up and walks, the better he will actually feel. Ginny Forth

## 2011-10-12 NOTE — Progress Notes (Signed)
1 Day Post-Op Subjective: Patient reports some discomfort. He has ambulated this morning. The patient had significant problems with nausea last night which is now improved.  Objective: Vital signs in last 24 hours: Temp:  [97.3 F (36.3 C)-98.6 F (37 C)] 98.6 F (37 C) (08/08 0524) Pulse Rate:  [67-79] 76  (08/08 0524) Resp:  [10-20] 16  (08/08 0524) BP: (121-156)/(71-91) 129/74 mmHg (08/08 0524) SpO2:  [98 %-100 %] 100 % (08/08 0524) Weight:  [101.061 kg (222 lb 12.8 oz)] 101.061 kg (222 lb 12.8 oz) (08/07 1327)  Intake/Output from previous day: 08/07 0701 - 08/08 0700 In: 5519.2 [P.O.:340; I.V.:3729.2; IV Piggyback:1450] Out: 3970 [Urine:3675; Drains:145; Blood:150] Intake/Output this shift:    Physical Exam:  General:alert and cooperative Cardiovascular:rrr Lungs: Normal effort GI: not done and soft Incisions: ok Urine:light pink Extremities:no edema  Lab Results:  Basename 10/12/11 0500 10/11/11 1225  HGB 14.6 15.0  HCT 42.1 44.5   BMET  Basename 10/12/11 0500  NA 142  K 3.9  CL 107  CO2 28  GLUCOSE 107*  BUN 7  CREATININE 1.04  CALCIUM 8.4   No results found for this basename: LABPT:3,INR:3 in the last 72 hours No results found for this basename: LABURIN:1 in the last 72 hours Results for orders placed during the hospital encounter of 10/06/11  SURGICAL PCR SCREEN     Status: Abnormal   Collection Time   10/06/11 11:40 AM      Component Value Range Status Comment   MRSA, PCR POSITIVE (*) NEGATIVE Final    Staphylococcus aureus POSITIVE (*) NEGATIVE Final     Studies/Results: No results found.  Assessment/Plan: 1 Day Post-Op, Procedure(s) (LRB): ROBOTIC ASSISTED LAPAROSCOPIC RADICAL PROSTATECTOMY (N/A) LYMPHADENECTOMY (Bilateral)  Ambulate, Incentive spirometry DVT prophylaxis Transition to PO pain medications D/C pelvic drain Probable d/c later today   LOS: 1 day   Eustace Hur S 10/12/2011, 7:56 AM

## 2011-10-12 NOTE — Discharge Summary (Signed)
  Date of admission: 10/11/2011  Date of discharge: 10/12/2011  Admission diagnosis: Prostate Cancer  Discharge diagnosis: Prostate Cancer  History and Physical: For full details, please see admission history and physical. Briefly, Tyrone Kline is a 61 y.o. gentleman with localized prostate cancer.  After discussing management/treatment options, he elected to proceed with surgical treatment.  Hospital Course: Cuahutemoc Attar was taken to the operating room on 10/11/2011 and underwent a robotic assisted laparoscopic radical prostatectomy. He tolerated this procedure well and without complications. Postoperatively, he was able to be transferred to a regular hospital room following recovery from anesthesia.  He was able to begin ambulating the night of surgery. He remained hemodynamically stable overnight.  He had excellent urine output with appropriately minimal output from his pelvic drain and his pelvic drain was removed on POD #1.  He was transitioned to oral pain medication, tolerated a clear liquid diet, and had met all discharge criteria and was able to be discharged home later on POD#1.  Laboratory values:  Basename 10/12/11 0500 10/11/11 1225  HGB 14.6 15.0  HCT 42.1 44.5    Disposition: Home  Discharge instruction: He was instructed to be ambulatory but to refrain from heavy lifting, strenuous activity, or driving. He was instructed on urethral catheter care.  Discharge medications:   Medication List  As of 10/12/2011  1:30 PM   START taking these medications         ciprofloxacin 500 MG tablet   Commonly known as: CIPRO   Take 1 tablet (500 mg total) by mouth 2 (two) times daily. Start day prior to office visit for foley removal      HYDROcodone-acetaminophen 5-325 MG per tablet   Commonly known as: NORCO/VICODIN   Take 1-2 tablets by mouth every 6 (six) hours as needed for pain.         STOP taking these medications         cholecalciferol 1000 UNITS tablet      fish  oil-omega-3 fatty acids 1000 MG capsule      VITAMIN B COMPLEX PO      vitamin B-12 500 MCG tablet      vitamin C 500 MG tablet          Where to get your medications    These are the prescriptions that you need to pick up.   You may get these medications from any pharmacy.         ciprofloxacin 500 MG tablet   HYDROcodone-acetaminophen 5-325 MG per tablet            Followup: He will followup in 1 week for catheter removal and to discuss his surgical pathology results.

## 2011-10-12 NOTE — Progress Notes (Signed)
Pt given 2 leg bags, clean urinal, clean drainage bag and extra gauze. Pt and wife educated on Foley care and discharge instructions. All questions answered. Julio Sicks RN

## 2011-10-12 NOTE — Progress Notes (Signed)
Patient as not yet walked. Patient encouraged to walk this am but states "I'll walk in a little bit". Tyrone Kline

## 2012-10-14 ENCOUNTER — Other Ambulatory Visit: Payer: Self-pay | Admitting: Internal Medicine

## 2012-10-14 DIAGNOSIS — F070 Personality change due to known physiological condition: Secondary | ICD-10-CM

## 2012-10-18 ENCOUNTER — Other Ambulatory Visit: Payer: 59

## 2012-10-19 ENCOUNTER — Encounter: Payer: Self-pay | Admitting: Neurology

## 2012-10-22 ENCOUNTER — Ambulatory Visit
Admission: RE | Admit: 2012-10-22 | Discharge: 2012-10-22 | Disposition: A | Discharge status: Home / Self Care | Payer: 59 | Source: Ambulatory Visit | Attending: Internal Medicine | Admitting: Internal Medicine

## 2012-10-22 ENCOUNTER — Encounter: Payer: Self-pay | Admitting: Neurology

## 2012-10-22 ENCOUNTER — Ambulatory Visit (INDEPENDENT_AMBULATORY_CARE_PROVIDER_SITE_OTHER): Payer: 59 | Admitting: Neurology

## 2012-10-22 VITALS — BP 131/73 | HR 80 | Ht 73.5 in | Wt 220.0 lb

## 2012-10-22 DIAGNOSIS — R4189 Other symptoms and signs involving cognitive functions and awareness: Secondary | ICD-10-CM

## 2012-10-22 DIAGNOSIS — F09 Unspecified mental disorder due to known physiological condition: Secondary | ICD-10-CM

## 2012-10-22 DIAGNOSIS — F070 Personality change due to known physiological condition: Secondary | ICD-10-CM

## 2012-10-22 MED ORDER — RIVASTIGMINE 4.6 MG/24HR TD PT24: 1.0000 | MEDICATED_PATCH | Freq: Every day | TRANSDERMAL | Status: DC

## 2012-10-22 MED ORDER — GADOBENATE DIMEGLUMINE 529 MG/ML IV SOLN
20.0000 mL | Freq: Once | INTRAVENOUS | Status: AC | PRN
  Administered 2012-10-22: 20 mL via INTRAVENOUS

## 2012-10-22 NOTE — Patient Instructions (Addendum)
Overall you are doing fairly well but I do want to suggest a few things today:   Remember to drink plenty of fluid, eat healthy meals and do not skip any meals. Try to eat protein with a every meal and eat a healthy snack such as fruit or nuts in between meals. Try to keep a regular sleep-wake schedule and try to exercise daily, particularly in the form of walking, 20-30 minutes a day, if you can.   As far as your medications are concerned, I would like to suggest starting a medication called Exelon. It is a patch and you will use one patch daily. Please alternate the site you place the patch.   As far as diagnostic testing: Please have a copy of your lab work and brain MRI sent over to our office.   I would like to see you back in  4 months, sooner if we need to. Please call us with any interim questions, concerns, problems, updates or refill requests.   Please also call us for any test results so we can go over those with you on the phone.  My clinical assistant and will answer any of your questions and relay your messages to me and also relay most of my messages to you.   Our phone number is [phone]. We also have an after hours call service for urgent matters and there is a physician on-call for urgent questions. For any emergencies you know to call 911 or go to the nearest emergency room

## 2012-10-22 NOTE — Progress Notes (Signed)
[facility]  Provider:  Dr Hosie Kline Referring Provider: [name], MD Primary Care Physician:  Tyrone German, MD  CC:  Memory problems  HPI:  Tyrone Kline is a 62 y.o. male here as a referral from Tyrone Kline for memory decline  The patient and his wife notes a 2 to three-year history of progressively worsening memory decline. Involves mainly his short-term memory, trouble calling day-to-day events. He will quickly forgetting to spelled. A severe example was recently he was coming off of work, went to pain pill, forgot where the back was, he then forgot where his home was. He recently moved but could not recall his new home address or his own home address. After a brief moment the information did come back to him. This has happened a few times. He has difficulty recalling where he put his keys and wallet and loses things around the house. Remote memory remains good. Able to perform most ADLs. His wife manages the finances, this has always been this way. 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 Denies any difficulty at work. Neither any hallucinations, no difficulty with sleep, no REM behavior disorder. He gets fatigued easily but otherwise is doing well from a health standpoint. Denies any current alcohol tobacco use, reports history of low social drinking. No history of diabetes, hypertension, no prior strokes or TIAs. 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  He has 2 sisters memory problems, one older and one younger, unsure what her diagnosis is.  Review of Systems: Out of a complete 14 system review, the patient complains of only the following symptoms, and all other reviewed systems are negative. Positive for memory loss cramps and restless legs  History   Social History  . Marital Status: Single    Spouse Name: N/A    Number of Children: 2   . Years  of Education: 12   Occupational History  .  [employer]  . Retired     Social History Main Topics  . Smoking status: Never Smoker   . Smokeless tobacco: Never Used  . Alcohol Use: No  . Drug Use: No  . Sexual Activity: Not on file   Other Topics Concern  . Not on file   Social History Narrative   Patient lives at home with his wife Tyrone Kline.   Patient is retired.    Patient has 2 adopted children.    Patient has a Barista.         History reviewed. No pertinent family history.  Past Medical History  Diagnosis Date  . Cancer     PROSTATE CANCER  . Sleep apnea 10/06/11    STOP BANG SCORE OF 4    Past Surgical History  Procedure Laterality Date  . Surgery on right index finger    . Robot assisted laparoscopic radical prostatectomy  10/11/2011    Procedure: ROBOTIC ASSISTED LAPAROSCOPIC RADICAL PROSTATECTOMY;  Surgeon: Valetta Fuller, MD;  Location: [facility];  Service: Urology;  Laterality: N/A;  Robotic Assisted Laparoscopic Radical Retropubic Prostatectomy with Bilateral Pelvic Lymph Node Dissection      Current Outpatient Prescriptions  Medication Sig Dispense Refill  . diclofenac (VOLTAREN) 75 MG EC tablet Take 75 mg by mouth 2 (two) times daily.      . GOLYTELY 236 G solution       . vardenafil (LEVITRA) 20 MG tablet Take 20  mg by mouth daily as needed for erectile dysfunction.       No current facility-administered medications for this visit.    Allergies as of 10/22/2012  . (No Known Allergies)    Vitals: BP 131/73  Pulse 80  Ht 6' 1.5" (1.867 m)  Wt 220 lb (99.791 kg)  BMI 28.63 kg/m2 Last Weight:  Wt Readings from Last 1 Encounters:  10/22/12 220 lb (99.791 kg)   Last Height:   Ht Readings from Last 1 Encounters:  10/22/12 6' 1.5" (1.867 m)     Physical exam: Exam: Gen: NAD, conversant Eyes: anicteric sclerae, moist conjunctivae HENT: [place] Neck: Trachea midline; supple,  Lungs: CTA, no wheezing, rales, rhonic                           CV: RRR, no MRG Abdomen: Soft, non-tender;  Extremities: No peripheral edema  Skin: Normal temperature, no rash,  Psych: Appropriate affect, pleasant  Neuro: MS: AA&Ox3, appropriately interactive, normal affect   MOCA 20/30 -1 executive -3 serial 7s -1 language -5 delayed recall  + Glabellar, negative snout, negative palmomental  CN: PERRL, EOMI no nystagmus, no ptosis, sensation intact to LT V1-V3 bilat, face symmetric, no weakness, hearing grossly intact, palate elevates symmetrically, shoulder shrug 5/5 bilat,  tongue protrudes midline, no fasiculations noted.  Motor: normal bulk and tone Strength: 5/5  In all extremities  Coord: rapid alternating and point-to-point (FNF, HTS) movements intact.  Reflexes: symmetrical, bilat downgoing toes  Sens: LT intact in all extremities  Gait: posture, stance, stride and arm-swing normal. Tandem gait intact. Able to walk on heels and toes. Romberg absent.   Assessment:  After physical and neurologic examination, review of laboratory studies, imaging, neurophysiology testing and pre-existing records, assessment will be reviewed on the problem list.  Plan:  Treatment plan and additional workup will be reviewed under Problem List.  Tyrone Kline is a pleasant 62 year old African American gentleman who presents for initial evaluation of cognitive decline, this has been slowly progressive over the past 2-3 years. Recently he has had a few episodes where he has forgotten his home address and how to get home. 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 Physical exam is most pertinent for a MOCA with his test score of 20/30. Otherwise his physical exam was mostly unremarkable. A basic metabolic lab workup and MRI images of the brain has out of been ordered by his primary care physician. Pending results of these we'll consider further diagnostic testing. After discussion with the patient and his wife  they decided to go ahead with starting medication to hopefully boost his cognitive function. Different options were discussed and they'll let us iodine and Exelon patch. Side effects were discussed.  1)Cognitive decline/dementia -will start Exelon patch 4.6mg  daily, can titrate up in the future as tolerated -will follow up lab work and MRI imaging, patient and wife to have information faxed to our office -will discuss continued driving and safety at next office visit -follow up in 3 to 4 months

## 2012-11-05 ENCOUNTER — Other Ambulatory Visit: Payer: Self-pay

## 2012-11-05 DIAGNOSIS — R4189 Other symptoms and signs involving cognitive functions and awareness: Secondary | ICD-10-CM

## 2012-11-05 MED ORDER — RIVASTIGMINE 4.6 MG/24HR TD PT24: 1.0000 | MEDICATED_PATCH | Freq: Every day | TRANSDERMAL | Status: DC

## 2013-02-18 ENCOUNTER — Ambulatory Visit: Payer: 59 | Admitting: Neurology

## 2013-02-20 ENCOUNTER — Ambulatory Visit: Payer: Self-pay | Admitting: Neurology

## 2014-06-23 ENCOUNTER — Other Ambulatory Visit: Payer: Self-pay | Admitting: Ophthalmology

## 2015-07-23 DIAGNOSIS — Z8546 Personal history of malignant neoplasm of prostate: Secondary | ICD-10-CM | POA: Diagnosis not present

## 2015-07-30 DIAGNOSIS — Z0000 Encounter for general adult medical examination without abnormal findings: Secondary | ICD-10-CM | POA: Diagnosis not present

## 2015-07-30 DIAGNOSIS — N5201 Erectile dysfunction due to arterial insufficiency: Secondary | ICD-10-CM | POA: Diagnosis not present

## 2015-07-30 DIAGNOSIS — Z8546 Personal history of malignant neoplasm of prostate: Secondary | ICD-10-CM | POA: Diagnosis not present

## 2015-11-16 DIAGNOSIS — Z6829 Body mass index (BMI) 29.0-29.9, adult: Secondary | ICD-10-CM | POA: Diagnosis not present

## 2015-11-16 DIAGNOSIS — Z0000 Encounter for general adult medical examination without abnormal findings: Secondary | ICD-10-CM | POA: Diagnosis not present

## 2016-03-09 DIAGNOSIS — Z23 Encounter for immunization: Secondary | ICD-10-CM | POA: Diagnosis not present

## 2016-03-09 DIAGNOSIS — Z79899 Other long term (current) drug therapy: Secondary | ICD-10-CM | POA: Diagnosis not present

## 2016-03-09 DIAGNOSIS — Z0000 Encounter for general adult medical examination without abnormal findings: Secondary | ICD-10-CM | POA: Diagnosis not present

## 2016-03-09 DIAGNOSIS — Z136 Encounter for screening for cardiovascular disorders: Secondary | ICD-10-CM | POA: Diagnosis not present

## 2016-03-09 DIAGNOSIS — Z13220 Encounter for screening for lipoid disorders: Secondary | ICD-10-CM | POA: Diagnosis not present

## 2016-03-09 DIAGNOSIS — C61 Malignant neoplasm of prostate: Secondary | ICD-10-CM | POA: Diagnosis not present

## 2016-03-09 DIAGNOSIS — Z131 Encounter for screening for diabetes mellitus: Secondary | ICD-10-CM | POA: Diagnosis not present

## 2016-03-09 DIAGNOSIS — N529 Male erectile dysfunction, unspecified: Secondary | ICD-10-CM | POA: Diagnosis not present

## 2016-03-25 DIAGNOSIS — H6123 Impacted cerumen, bilateral: Secondary | ICD-10-CM | POA: Diagnosis not present

## 2016-03-25 DIAGNOSIS — K08409 Partial loss of teeth, unspecified cause, unspecified class: Secondary | ICD-10-CM | POA: Diagnosis not present

## 2016-03-25 DIAGNOSIS — Z6829 Body mass index (BMI) 29.0-29.9, adult: Secondary | ICD-10-CM | POA: Diagnosis not present

## 2016-03-25 DIAGNOSIS — Z0000 Encounter for general adult medical examination without abnormal findings: Secondary | ICD-10-CM | POA: Diagnosis not present

## 2016-03-25 DIAGNOSIS — Z972 Presence of dental prosthetic device (complete) (partial): Secondary | ICD-10-CM | POA: Diagnosis not present

## 2016-05-10 DIAGNOSIS — Z8546 Personal history of malignant neoplasm of prostate: Secondary | ICD-10-CM | POA: Diagnosis not present

## 2016-05-17 DIAGNOSIS — Z8546 Personal history of malignant neoplasm of prostate: Secondary | ICD-10-CM | POA: Diagnosis not present

## 2016-05-17 DIAGNOSIS — N5201 Erectile dysfunction due to arterial insufficiency: Secondary | ICD-10-CM | POA: Diagnosis not present

## 2016-05-30 ENCOUNTER — Ambulatory Visit (INDEPENDENT_AMBULATORY_CARE_PROVIDER_SITE_OTHER): Payer: Medicare HMO

## 2016-05-30 ENCOUNTER — Encounter (HOSPITAL_COMMUNITY): Payer: Self-pay | Admitting: *Deleted

## 2016-05-30 ENCOUNTER — Ambulatory Visit (HOSPITAL_COMMUNITY)
Admission: EM | Admit: 2016-05-30 | Discharge: 2016-05-30 | Disposition: A | Discharge status: Home / Self Care | Payer: Medicare HMO | Attending: Internal Medicine | Admitting: Internal Medicine

## 2016-05-30 DIAGNOSIS — J209 Acute bronchitis, unspecified: Secondary | ICD-10-CM | POA: Diagnosis not present

## 2016-05-30 DIAGNOSIS — M791 Myalgia: Secondary | ICD-10-CM

## 2016-05-30 DIAGNOSIS — R69 Illness, unspecified: Secondary | ICD-10-CM

## 2016-05-30 DIAGNOSIS — R509 Fever, unspecified: Secondary | ICD-10-CM

## 2016-05-30 DIAGNOSIS — J111 Influenza due to unidentified influenza virus with other respiratory manifestations: Secondary | ICD-10-CM

## 2016-05-30 DIAGNOSIS — R05 Cough: Secondary | ICD-10-CM | POA: Diagnosis not present

## 2016-05-30 MED ORDER — ALBUTEROL SULFATE HFA 108 (90 BASE) MCG/ACT IN AERS: 1.0000 | INHALATION_SPRAY | Freq: Four times a day (QID) | RESPIRATORY_TRACT | 0 refills | Status: DC | PRN

## 2016-05-30 MED ORDER — ACETAMINOPHEN 325 MG PO TABS
650.0000 mg | ORAL_TABLET | Freq: Once | ORAL | Status: AC
  Administered 2016-05-30: 650 mg via ORAL

## 2016-05-30 MED ORDER — IPRATROPIUM-ALBUTEROL 0.5-2.5 (3) MG/3ML IN SOLN
RESPIRATORY_TRACT | Status: AC
  Filled 2016-05-30: qty 3

## 2016-05-30 MED ORDER — ACETAMINOPHEN 325 MG PO TABS
ORAL_TABLET | ORAL | Status: AC
  Filled 2016-05-30: qty 2

## 2016-05-30 MED ORDER — METHYLPREDNISOLONE 4 MG PO TBPK: ORAL_TABLET | ORAL | 0 refills | Status: DC

## 2016-05-30 MED ORDER — IPRATROPIUM-ALBUTEROL 0.5-2.5 (3) MG/3ML IN SOLN
3.0000 mL | Freq: Once | RESPIRATORY_TRACT | Status: AC
  Administered 2016-05-30: 3 mL via RESPIRATORY_TRACT

## 2016-05-30 MED ORDER — AZITHROMYCIN 250 MG PO TABS: 250.0000 mg | ORAL_TABLET | Freq: Every day | ORAL | 0 refills | Status: DC

## 2016-05-30 MED ORDER — DM-GUAIFENESIN ER 30-600 MG PO TB12: 1.0000 | ORAL_TABLET | Freq: Two times a day (BID) | ORAL | 0 refills | Status: DC

## 2016-05-30 NOTE — Discharge Instructions (Signed)
Tylenol/Motrin as needed for fever. Increase rest and hydration. FU with PCP x 3 days. If Sx worsens advised to return to clinic for re evaluation

## 2016-05-30 NOTE — ED Provider Notes (Signed)
CSN: [id number]     Arrival date & time 05/30/16  1742 History   None    Chief Complaint  Patient presents with  . Fever   (Consider location/radiation/quality/duration/timing/severity/associated sxs/prior Treatment) The history is provided by the patient.  Fever  Temp source:  Oral Onset quality:  Gradual Duration:  2 weeks Timing:  Intermittent Progression:  Waxing and waning Chronicity:  New Relieved by:  Nothing Worsened by:  Nothing Associated symptoms: chills, cough and myalgias   66 Y/O male presented with CC of fever, productive cough, fatigue, body aches x 2 weeks, worse for last 2 days. Pt denies CP, SOB, palpitations. Fiancee tested positive for FLU 4 weeks ago. Pt A&O x3. No acute visible distress.  Past Medical History:  Diagnosis Date  . Cancer [facility])    PROSTATE CANCER  . Sleep apnea 10/06/11   STOP BANG SCORE OF 4   Past Surgical History:  Procedure Laterality Date  . ROBOT ASSISTED LAPAROSCOPIC RADICAL PROSTATECTOMY  10/11/2011   Procedure: ROBOTIC ASSISTED LAPAROSCOPIC RADICAL PROSTATECTOMY;  Surgeon: Bernestine Amass, MD;  Location: [facility];  Service: Urology;  Laterality: N/A;  Robotic Assisted Laparoscopic Radical Retropubic Prostatectomy with Bilateral Pelvic Lymph Node Dissection    . SURGERY ON RIGHT INDEX FINGER     History reviewed. No pertinent family history. Social History  Substance Use Topics  . Smoking status: Never Smoker  . Smokeless tobacco: Never Used  . Alcohol use No    Review of Systems  Constitutional: Positive for chills, fatigue and fever.  Respiratory: Positive for cough.   Musculoskeletal: Positive for myalgias.    Allergies  Patient has no known allergies.  Home Medications   Prior to Admission medications   Medication Sig Start Date End Date Taking? Authorizing Provider  albuterol (PROVENTIL HFA;VENTOLIN HFA) 108 (90 Base) MCG/ACT inhaler Inhale 1-2 puffs into the lungs every 6 (six) hours as needed for wheezing or  shortness of breath. 05/30/16   Burhanuddin Kohlmann, NP  azithromycin (ZITHROMAX) 250 MG tablet Take 1 tablet (250 mg total) by mouth daily. Take first 2 tablets together, then 1 every day until finished. 05/30/16   Golden Gilreath, NP  dextromethorphan-guaiFENesin (MUCINEX DM) 30-600 MG 12hr tablet Take 1 tablet by mouth 2 (two) times daily. 05/30/16   Briellah Baik, NP  diclofenac (VOLTAREN) 75 MG EC tablet Take 75 mg by mouth 2 (two) times daily.    Historical Provider, MD  GOLYTELY 236 G solution  10/16/12   Historical Provider, MD  methylPREDNISolone (MEDROL DOSEPAK) 4 MG TBPK tablet 21 05/30/16   Marria Mathison, NP  rivastigmine (EXELON) 4.6 mg/24hr Place 1 patch (4.6 mg total) onto the skin daily. One patch daily as instructed. Alternate patch placement 11/05/12   Drema Dallas, DO  vardenafil (LEVITRA) 20 MG tablet Take 20 mg by mouth daily as needed for erectile dysfunction.    Historical Provider, MD   Meds Ordered and Administered this Visit   Medications  acetaminophen (TYLENOL) tablet 650 mg (650 mg Oral Given 05/30/16 1827)  ipratropium-albuterol (DUONEB) 0.5-2.5 (3) MG/3ML nebulizer solution 3 mL (3 mLs Nebulization Given 05/30/16 1914)    BP 140/82 (BP Location: Right Arm)   Pulse (!) 128   Temp (!) 103.1 F (39.5 C) (Oral)   Resp (!) 24   SpO2 97%  No data found.   Physical Exam  Constitutional: He is oriented to person, place, and time. He appears well-developed and well-nourished. No distress.  HENT:  Head: Normocephalic.  Eyes:  Pupils are equal, round, and reactive to light.  Neck: Neck supple.  Cardiovascular: Regular rhythm and normal heart sounds.   Tachycardic, likely from fever  Pulmonary/Chest: Effort normal. No respiratory distress. He has no decreased breath sounds. He has wheezes (Diffuse wheezing with coarse crackles to RML). He exhibits no tenderness.  Abdominal: Soft. Bowel sounds are normal.  Neurological: He is alert and oriented to person, place,  and time.  Skin: Skin is warm.  Psychiatric: He has a normal mood and affect.    Urgent Care Course     Procedures (including critical care time)  Labs Review Labs Reviewed - No data to display  Imaging Review Dg Chest 2 View  Result Date: 05/30/2016 CLINICAL DATA:  Productive cough, fever EXAM: CHEST  2 VIEW COMPARISON:  None. FINDINGS: Cardiomediastinal silhouette is unremarkable. No infiltrate or pulmonary edema. Mild infrahilar bronchitic changes. Mild degenerative changes mid thoracic spine. IMPRESSION: No infiltrate or pulmonary edema. Mild infrahilar bronchitic changes. Electronically Signed   By: Lahoma Crocker M.D.   On: 05/30/2016 19:13     Visual Acuity Review  Right Eye Distance:   Left Eye Distance:   Bilateral Distance:    Right Eye Near:   Left Eye Near:    Bilateral Near:         MDM   1. Influenza-like illness   2. Bronchospasm with bronchitis, acute   CXR: +ve for Bronchitis. Tx with Zpack as pt likely has Flu which is exacerbating pt's condition. Pt out of time frame to start Tamiflu. Tylenol/Motrin as needed for fever. Increase rest and hydration. FU with PCP x 3 days. If Sx worsens advised to return to clinic for re evaluation    Teola Bradley, NP 05/30/16 [place], NP 05/30/16 1930

## 2016-05-30 NOTE — ED Notes (Signed)
Nurse   practioner      Came  Back in to  Examine  The  Patient

## 2016-05-30 NOTE — ED Triage Notes (Signed)
Pt has  Been  Feeling  Well  For  Several  Weeks       Pt   Reports     yest  He  Developed  Boy  Aches   And  Weakness    With      Chills      He  h=denys  Any  Urinary  Symptoms

## 2016-09-14 DIAGNOSIS — H6122 Impacted cerumen, left ear: Secondary | ICD-10-CM | POA: Diagnosis not present

## 2016-09-14 DIAGNOSIS — K5730 Diverticulosis of large intestine without perforation or abscess without bleeding: Secondary | ICD-10-CM | POA: Diagnosis not present

## 2016-09-14 DIAGNOSIS — C61 Malignant neoplasm of prostate: Secondary | ICD-10-CM | POA: Diagnosis not present

## 2016-09-14 DIAGNOSIS — N529 Male erectile dysfunction, unspecified: Secondary | ICD-10-CM | POA: Diagnosis not present

## 2016-11-10 DIAGNOSIS — Z8546 Personal history of malignant neoplasm of prostate: Secondary | ICD-10-CM | POA: Diagnosis not present

## 2016-11-21 DIAGNOSIS — C61 Malignant neoplasm of prostate: Secondary | ICD-10-CM | POA: Diagnosis not present

## 2016-11-21 DIAGNOSIS — N393 Stress incontinence (female) (male): Secondary | ICD-10-CM | POA: Diagnosis not present

## 2016-11-21 DIAGNOSIS — N5201 Erectile dysfunction due to arterial insufficiency: Secondary | ICD-10-CM | POA: Diagnosis not present

## 2017-03-13 DIAGNOSIS — Z961 Presence of intraocular lens: Secondary | ICD-10-CM | POA: Diagnosis not present

## 2017-03-13 DIAGNOSIS — H40013 Open angle with borderline findings, low risk, bilateral: Secondary | ICD-10-CM | POA: Diagnosis not present

## 2017-03-13 DIAGNOSIS — H53001 Unspecified amblyopia, right eye: Secondary | ICD-10-CM | POA: Diagnosis not present

## 2017-03-23 DIAGNOSIS — N529 Male erectile dysfunction, unspecified: Secondary | ICD-10-CM | POA: Diagnosis not present

## 2017-03-23 DIAGNOSIS — R030 Elevated blood-pressure reading, without diagnosis of hypertension: Secondary | ICD-10-CM | POA: Diagnosis not present

## 2017-04-05 ENCOUNTER — Encounter: Payer: Self-pay | Admitting: Family Medicine

## 2017-04-05 ENCOUNTER — Ambulatory Visit (INDEPENDENT_AMBULATORY_CARE_PROVIDER_SITE_OTHER): Payer: Medicare HMO | Admitting: Family Medicine

## 2017-04-05 ENCOUNTER — Other Ambulatory Visit: Payer: Self-pay

## 2017-04-05 VITALS — BP 122/76 | HR 89 | Temp 97.9°F | Ht 73.0 in | Wt 235.6 lb

## 2017-04-05 DIAGNOSIS — R7303 Prediabetes: Secondary | ICD-10-CM | POA: Insufficient documentation

## 2017-04-05 DIAGNOSIS — Z7689 Persons encountering health services in other specified circumstances: Secondary | ICD-10-CM | POA: Diagnosis not present

## 2017-04-05 DIAGNOSIS — B351 Tinea unguium: Secondary | ICD-10-CM | POA: Insufficient documentation

## 2017-04-05 DIAGNOSIS — Z23 Encounter for immunization: Secondary | ICD-10-CM | POA: Diagnosis not present

## 2017-04-05 DIAGNOSIS — R011 Cardiac murmur, unspecified: Secondary | ICD-10-CM | POA: Diagnosis not present

## 2017-04-05 DIAGNOSIS — R4189 Other symptoms and signs involving cognitive functions and awareness: Secondary | ICD-10-CM

## 2017-04-05 DIAGNOSIS — Z8546 Personal history of malignant neoplasm of prostate: Secondary | ICD-10-CM | POA: Insufficient documentation

## 2017-04-05 DIAGNOSIS — H9193 Unspecified hearing loss, bilateral: Secondary | ICD-10-CM | POA: Diagnosis not present

## 2017-04-05 HISTORY — DX: Persons encountering health services in other specified circumstances: Z76.89

## 2017-04-05 LAB — POCT GLYCOSYLATED HEMOGLOBIN (HGB A1C): Hemoglobin A1C: 5.9

## 2017-04-05 NOTE — Assessment & Plan Note (Signed)
Patient has concern of slow memory loss, his wife also has concerns but we did not have time to properly address this during this visit.   They are going to schedule a f/u appt with me sometime next month to have a more dedicated visit about memory

## 2017-04-05 NOTE — Patient Instructions (Signed)
It was a pleasure to see you today! Thank you for choosing [facility] for your primary care. Tyrone Kline was seen for establishing care. Come back to the clinic if you have any new concerns, and go to the emergency room if you have any life threatening concerns.   Today we ordered a diabetes blood lab, a hep C screening for your liver, and two vaccines against the flu and pneumococcal virus.   We also discussed over the counter meds for your toe fungus and we are getting a hearing test for your complaints of hearing loss.  We'll discuss the labs on your next visit and I'll call you if there is anything concerning.  If we did any lab work today, and the results require attention, either me or my nurse will get in touch with you. If everything is normal, you will get a letter in mail and a message via . If you don't hear from [place] in two weeks, please give [place] a call. Otherwise, we look forward to seeing you again at your next visit. If you have any questions or concerns before then, please call the clinic at [phone].  Please bring all your medications to every doctors visit  Sign up for My Chart to have easy access to your labs results, and communication with your Primary care physician.    Please check-out at the front desk before leaving the clinic.    Best,  Dr. Sherene Sires FAMILY MEDICINE RESIDENT - PGY1 04/05/2017 2:24 PM

## 2017-04-05 NOTE — Assessment & Plan Note (Signed)
Significant hearing deficit on exam today (CNA will chart exact numbers).   Will refer to audiology.

## 2017-04-05 NOTE — Progress Notes (Signed)
Subjective:  Tyrone Kline is a 67 y.o. male who presents to the [facility] today with a chief complaint of establishing care.   HPI:  He claimed no medications during exam and there were none in his packet, I did not see meds input during CNA checkin and missed discussing these with him, will do so at followup that is planned. He has a hx of prostate cancer and s/p prostatectomy but does not remember who did it.  He does however followup with them annually and has had no abnormal findings since  He complains of hearing loss and his wife says he turns the TV up too loud.   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   He wonders if earwax could be contributing but it's bad even when he cleans out his ears.  His wife pointed out a toenail fungus he has never taken any medication for on his R great toe.   He says it doesn't cause any pain but it doesn't look healthy, the surrounding toe is normal and it's just the nail that is the problem.  He is not sure when he was last checked for diabetes and agrees to a lab draw, he also agrees to flu and pnuemococcal vax and a HepC screen.  As we were closing visit his wife mentioned memory concerns and agreed to come back for a followup to more adequately address those concerns.  Objective:  Physical Exam: BP 122/76   Pulse 89   Temp 97.9 F (36.6 C) (Oral)   Ht 6\' 1"  (1.854 m)   Wt 235 lb 9.6 oz (106.9 kg)   SpO2 93%   BMI 31.08 kg/m   Gen: NAD, resting comfortably Ear: no erythema in canals, earwax buildup bilaterally. CV: RRR with 2/6 systolic murmur appreciated Pulm: NWOB, CTAB with no crackles, wheezes, or rhonchi GI: Normal bowel sounds present. Soft, Nontender, Nondistended. MSK: no edema, cyanosis, or clubbing noted Skin: warm, dry. Feet: no ulcers noted, but did have fungal infection on R great toe Neuro: grossly normal, moves all extremities Psych: Normal affect but seemed to have trouble with  some long term memory questions and life history  Results for orders placed or performed in visit on 04/05/17 (from the past 72 hour(s))  HgB A1c     Status: None   Collection Time: 04/05/17  2:28 PM  Result Value Ref Range   Hemoglobin A1C 5.9      Assessment/Plan:  History of prostate cancer Following with urology annually after prostatectomy in 2014.  Does not remember the name of the clinic but will find records and fill out a release of information.  No new related complaints  Cognitive decline Patient has concern of slow memory loss, his wife also has concerns but we did not have time to properly address this during this visit.   They are going to schedule a f/u appt with me sometime next month to have a more dedicated visit about memory  Fungal infection of toenail Significant fungal infection of big toenail on R foot.   Will start with OTC antifungal, we discussed significant time frame for cure with toe nail fungus and potential to eventually need expensive Rx meds but that it would be worth trying OTC first.  Hearing problem of both ears Significant hearing deficit on exam today (CNA will chart exact numbers).   Will refer to audiology.  Encounter to establish care Patient to  fill out release of information for clinics that handled his prostate cancer and colonoscopy.  Flu and pneumococcal vaccine.   HepC screen and A1C.  Undiagnosed cardiac murmurs Systolic with no known hx although patient has memory issues.   He is completely assymptomatic with normal BP.   He did have a prostatectomy in 2014 so I suspect was put through a cardiac assesment then.  I will try to obtain those records through a release of information as soon as he remembers the name of his urologist.  Pre-diabetes Patient did not know their last diabetic lab check.  A1C 5.9 in clinic today, resulted after patient left.   Will discuss in more detail as they are planning a close followup in February.   Sherene Sires, [place] - PGY1 04/05/2017 5:38 PM

## 2017-04-05 NOTE — Assessment & Plan Note (Signed)
Systolic with no known hx although patient has memory issues.   He is completely assymptomatic with normal BP.   He did have a prostatectomy in 2014 so I suspect was put through a cardiac assesment then.  I will try to obtain those records through a release of information as soon as he remembers the name of his urologist.

## 2017-04-05 NOTE — Assessment & Plan Note (Addendum)
Patient to fill out release of information for clinics that handled his prostate cancer and colonoscopy.  Flu and pneumococcal vaccine.   HepC screen and A1C.

## 2017-04-05 NOTE — Assessment & Plan Note (Signed)
Following with urology annually after prostatectomy in 2014.  Does not remember the name of the clinic but will find records and fill out a release of information.  No new related complaints

## 2017-04-05 NOTE — Assessment & Plan Note (Signed)
Patient did not know their last diabetic lab check.  A1C 5.9 in clinic today, resulted after patient left.   Will discuss in more detail as they are planning a close followup in February.

## 2017-04-05 NOTE — Assessment & Plan Note (Signed)
Significant fungal infection of big toenail on R foot.   Will start with OTC antifungal, we discussed significant time frame for cure with toe nail fungus and potential to eventually need expensive Rx meds but that it would be worth trying OTC first.

## 2017-04-06 LAB — HEPATITIS C ANTIBODY: Hep C Virus Ab: <0.1 s/co ratio (ref 0.0–0.9)

## 2017-04-20 ENCOUNTER — Encounter: Payer: Self-pay | Admitting: Family Medicine

## 2017-04-20 ENCOUNTER — Other Ambulatory Visit: Payer: Self-pay

## 2017-04-20 ENCOUNTER — Ambulatory Visit (INDEPENDENT_AMBULATORY_CARE_PROVIDER_SITE_OTHER): Payer: Medicare HMO | Admitting: Family Medicine

## 2017-04-20 VITALS — BP 108/86 | HR 96 | Temp 97.8°F | Ht 74.0 in | Wt 232.2 lb

## 2017-04-20 DIAGNOSIS — H9193 Unspecified hearing loss, bilateral: Secondary | ICD-10-CM

## 2017-04-20 DIAGNOSIS — R4189 Other symptoms and signs involving cognitive functions and awareness: Secondary | ICD-10-CM | POA: Diagnosis not present

## 2017-04-20 NOTE — Patient Instructions (Signed)
It was a pleasure to see you today! Thank you for choosing [facility] for your primary care. Tyrone Kline was seen for dementia. Come back to the clinic if you have any need to discuss our chronic care, and go to the emergency room if you have any life threatening symptoms.  Our social worker will be contacting you to help arrange support.    If we did any lab work today, and the results require attention, either me or my nurse will get in touch with you. If everything is normal, you will get a letter in mail and a message via . If you don't hear from [place] in two weeks, please give [place] a call. Otherwise, we look forward to seeing you again at your next visit. If you have any questions or concerns before then, please call the clinic at [phone].  Please bring all your medications to every doctors visit  Sign up for My Chart to have easy access to your labs results, and communication with your Primary care physician.    Please check-out at the front desk before leaving the clinic.    Best,  Dr. Sherene Sires FAMILY MEDICINE RESIDENT - PGY1 04/20/2017 2:35 PM

## 2017-04-21 ENCOUNTER — Encounter: Payer: Self-pay | Admitting: Family Medicine

## 2017-04-21 NOTE — Assessment & Plan Note (Addendum)
No changes since last visit Patient is happy with plan to followup with audiology and has appt scheduled.

## 2017-04-21 NOTE — Progress Notes (Signed)
    Subjective:  Tyrone Kline is a 67 y.o. male who presents to the [facility] today with a chief complaint of decline in cognitive function.   HPI: This is a f/u from his initial visit to establish care that was scheduled specifically to address his concerns about cognitive decline.  He mentions he has a lot of problems with short term memory but not long term at this point.   He also has trouble remembering complex directions when going across town if he doesn't write them down.   He's not really sure how diminished he is but says he definitely isn't thinking as fast/clearly as he used to.  He did not remember the prior appt with neurology and his prior dementia diagnosis from 2014 (he is in a new relationship so wife was also unaware)  Objective:  Physical Exam: BP 108/86   Pulse 96   Temp 97.8 F (36.6 C) (Oral)   Ht 6\' 2"  (1.88 m)   Wt 232 lb 3.2 oz (105.3 kg)   SpO2 93%   BMI 29.81 kg/m   Gen: NAD, resting comfortably CV: RRR with no murmurs appreciated Pulm: NWOB, CTAB with no crackles, wheezes, or rhonchi GI: Soft, Nontender, Nondistended. MSK: no edema, cyanosis, or clubbing noted Skin: warm, dry Neuro: grossly normal, moves all extremities Psych: Normal affect and diminished thought content/memory.  [place] 17/30  No results found for this or any previous visit (from the past 72 hour(s)).   Assessment/Plan:  Hearing problem of both ears No changes since last visit Patient is happy with plan to followup with audiology and has appt scheduled.  Cognitive decline Prior diagnosis of dementia by neurology with a MOCA score of 20/30 in 2014.   MOCA score today was 17/30.   We discussed pros and cons of medication for dementia and patient would like to take some time to discuss with his wife.   They are appreciative of referal to Social Work for help connecting with available dementia/alzheimers resources in the community and for direction in planning power of attny for  health/finances/advanced directives.   Sherene Sires, [place] - PGY1 04/21/2017 4:51 PM

## 2017-04-21 NOTE — Assessment & Plan Note (Signed)
Prior diagnosis of dementia by neurology with a MOCA score of 20/30 in 2014.   MOCA score today was 17/30.   We discussed pros and cons of medication for dementia and patient would like to take some time to discuss with his wife.   They are appreciative of referal to Social Work for help connecting with available dementia/alzheimers resources in the community and for direction in planning power of attny for health/finances/advanced directives.

## 2017-04-28 ENCOUNTER — Other Ambulatory Visit: Payer: Self-pay | Admitting: Family Medicine

## 2017-04-28 DIAGNOSIS — R4189 Other symptoms and signs involving cognitive functions and awareness: Secondary | ICD-10-CM

## 2017-04-28 NOTE — Progress Notes (Signed)
Order TSH/B12/RPR/HIV/CMP/CBC as part of further dementia screening

## 2017-04-30 ENCOUNTER — Other Ambulatory Visit: Payer: Medicare HMO

## 2017-04-30 ENCOUNTER — Telehealth: Payer: Self-pay | Admitting: *Deleted

## 2017-04-30 DIAGNOSIS — R4189 Other symptoms and signs involving cognitive functions and awareness: Secondary | ICD-10-CM

## 2017-04-30 NOTE — Telephone Encounter (Signed)
Pt informed and made lab visit appt for this afternoon. Liticia Gasior, Salome Spotted, CMA

## 2017-04-30 NOTE — Telephone Encounter (Signed)
-----   Message from Sherene Sires, DO sent at 04/28/2017  1:45 PM EST ----- Please notify patient I'd like to draw some more labs to make sure there isn't a hormone/infection/mineral problem contributing to his memory problems.   The orders are in and he can schedule a lab visit to just have them drawn and we can talk about them after the results have all come back.   He does not need a full appt. With a doctor just to get the labs drawn. -Thanks

## 2017-05-01 ENCOUNTER — Telehealth: Payer: Self-pay | Admitting: *Deleted

## 2017-05-01 LAB — COMPREHENSIVE METABOLIC PANEL
ALT: 26 IU/L (ref 0–44)
AST: 24 IU/L (ref 0–40)
Albumin/Globulin Ratio: 1.4 (ref 1.2–2.2)
Albumin: 4.1 g/dL (ref 3.6–4.8)
Alkaline Phosphatase: 103 IU/L (ref 39–117)
BUN/Creatinine Ratio: 10 (ref 10–24)
BUN: 12 mg/dL (ref 8–27)
Bilirubin Total: 0.5 mg/dL (ref 0.0–1.2)
CO2: 24 mmol/L (ref 20–29)
Calcium: 9.3 mg/dL (ref 8.6–10.2)
Chloride: 106 mmol/L (ref 96–106)
Creatinine, Ser: 1.26 mg/dL (ref 0.76–1.27)
GFR calc Af Amer: 67 mL/min/{1.73_m2} (ref >59)
GFR calc non Af Amer: 58 mL/min/{1.73_m2} — ABNORMAL LOW (ref >59)
Globulin, Total: 3 g/dL (ref 1.5–4.5)
Glucose: 102 mg/dL — ABNORMAL HIGH (ref 65–99)
Potassium: 4.2 mmol/L (ref 3.5–5.2)
Sodium: 145 mmol/L — ABNORMAL HIGH (ref 134–144)
Total Protein: 7.1 g/dL (ref 6.0–8.5)

## 2017-05-01 LAB — CBC
Hematocrit: 44.1 % (ref 37.5–51.0)
Hemoglobin: 14.8 g/dL (ref 13.0–17.7)
MCH: 29.8 pg (ref 26.6–33.0)
MCHC: 33.6 g/dL (ref 31.5–35.7)
MCV: 89 fL (ref 79–97)
Platelets: 169 10*3/uL (ref 150–379)
RBC: 4.97 x10E6/uL (ref 4.14–5.80)
RDW: 15.1 % (ref 12.3–15.4)
WBC: 5.1 10*3/uL (ref 3.4–10.8)

## 2017-05-01 LAB — VITAMIN B12: Vitamin B-12: 514 pg/mL (ref 232–1245)

## 2017-05-01 LAB — RPR: RPR Ser Ql: NONREACTIVE

## 2017-05-01 LAB — TSH: TSH: 0.491 u[IU]/mL (ref 0.450–4.500)

## 2017-05-01 LAB — HIV ANTIBODY (ROUTINE TESTING W REFLEX): HIV Screen 4th Generation wRfx: NONREACTIVE

## 2017-05-01 NOTE — Telephone Encounter (Signed)
-----   Message from [place] Shirley, DO sent at 05/01/2017  8:41 AM EST ----- Please inform patient of normal results

## 2017-05-01 NOTE — Telephone Encounter (Signed)
Pt informed. Zimmerman Rumple, Darlyne Schmiesing D, CMA  

## 2017-05-16 DIAGNOSIS — C61 Malignant neoplasm of prostate: Secondary | ICD-10-CM | POA: Diagnosis not present

## 2017-05-22 DIAGNOSIS — N433 Hydrocele, unspecified: Secondary | ICD-10-CM | POA: Diagnosis not present

## 2017-05-22 DIAGNOSIS — C61 Malignant neoplasm of prostate: Secondary | ICD-10-CM | POA: Diagnosis not present

## 2017-05-22 DIAGNOSIS — N5201 Erectile dysfunction due to arterial insufficiency: Secondary | ICD-10-CM | POA: Diagnosis not present

## 2017-05-22 DIAGNOSIS — N393 Stress incontinence (female) (male): Secondary | ICD-10-CM | POA: Diagnosis not present

## 2017-07-04 ENCOUNTER — Ambulatory Visit: Payer: Medicare HMO | Attending: Family Medicine | Admitting: Audiology

## 2017-07-04 ENCOUNTER — Other Ambulatory Visit: Payer: Self-pay | Admitting: Family Medicine

## 2017-07-04 DIAGNOSIS — H93299 Other abnormal auditory perceptions, unspecified ear: Secondary | ICD-10-CM

## 2017-07-04 DIAGNOSIS — H918X9 Other specified hearing loss, unspecified ear: Secondary | ICD-10-CM | POA: Insufficient documentation

## 2017-07-04 DIAGNOSIS — H9193 Unspecified hearing loss, bilateral: Secondary | ICD-10-CM

## 2017-07-04 DIAGNOSIS — H9192 Unspecified hearing loss, left ear: Secondary | ICD-10-CM | POA: Diagnosis present

## 2017-07-04 DIAGNOSIS — H9190 Unspecified hearing loss, unspecified ear: Secondary | ICD-10-CM

## 2017-07-04 NOTE — Progress Notes (Signed)
Per audiology request, referring to ENT for further workup of hearing loss  -Dr. Criss Rosales

## 2017-07-04 NOTE — Procedures (Signed)
[facility] [place]  [place], [place] [address]  [phone]   Audiological Evaluation  Patient Name: Tyrone Kline  Status: Outpatient   DOB: May 18, 1950    Diagnosis: Hearing Loss      MRN: [id number] Date:  07/04/2017     Referent: Sherene Sires, DO  History: Tyrone Kline was seen for an audiological evaluation. Accompanied by: Tyrone Kline Cuffe's wife.  Primary Concern: "Didn/t pass hearing test at the physician's office". Pain: None History of hearing problems: None reported by Tyrone Kline but his wife states that he "doesn't know what he can't hear". For example, Tyrone Kline "thinks the phone is broken at times because he can't hear it, but it is working fine".  History of ear infections:  N History of dizziness/vertigo:  N History of balance issues:   N Tinnitus: N History of occupational noise exposure: Tyrone Kline for 30 years - wore hearing protection.Marland Kitchen History of hypertension: N History of diabetes:  N Family history of hearing loss: N  Other concerns: Excessive ear wax at times.   Evaluation: Conventional pure tone audiometry from 250Hz  - 8000Hz  with using insert earphones. The hearing loss is sensorineural bialterally. Hearing Thresholds: Right ear:  Thresholds of 30-40dBHL from 250Hz  - 2000Hz  and 55-65 dBHL from 4000Hz  - 8000Hz .  Left ear:    Thresholds of 40 dBHl from 250Hz  -750Hz ; 50-55 dBHL from 1000Hz -2000Hz ; and 60-65 dBHL from 3000Hz  - 8000Hz . Reliability is good Speech reception levels (repeating words near threshold) using recorded spondee word lists:  Right ear: 35 dBHL.  Left ear:  50 dBHL Word recognition (at comfortably loud volumes) using recorded NU-6 word lists, in quiet.  Right ear: 95% at 75 dBHL with 70 dB contralateral masking using speech noise.  Left ear:   60% at 90dBHL with 85 dBHL contralateral masking using speech noise. Tympanometry shows normal middle ear volume, pressure and  compliance bilaterally (Type A).  Otoscopic inspections shows non occluding ear wax bilaterally. The tympanic membrane does not appear red.  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 Middle ear pressure is within normal limits bilaterally. No recruitment is present.  Word recognition was presented at comfortably loud levels in each ear which was extremely loud on the left side, with poor word recognition and very loud in the right ear with excellent word recognition. As discussed,most people are unable to speak at such loud volume so Tyrone Kline probably makes guesses of what he thought was said, which may or may not be correct, which is a safety issue and may mimic confusion or dementia.  Tyrone Kline is a candidate for hearing aids and these are strongly recommended for him.  Tyrone Kline needs referral to an ENT because of the much poorer word recognition on the left side, even though hearing thresholds are similar. For all communication, in person or in a group setting, this amount of hearing loss would adversely affect speech communication. The test results were discussed and Tyrone Kline counseled.  RECOMMENDATIONS: 1.   Referral to ENT because of the very poor word recognition on the left side even through the right ear is excellent in quiet. Also for ear wax removal. 2.   A hearing aid evaluation for bilateral hearing aids at the ENT office or elsewhere.  In addition, Tyrone Kline is to contact [facility] to see whether he qualified for one state assisted free hearing aid. 3.  A captioned telephone through [place] was discussed and Tyrone Kline wants to try this free amplified phone.  4.   Monitor hearing closely with a repeat audiological evaluation in 3-6 months (earlier if there is any change in hearing or ear pressure).    5.  Strategies that help improve hearing  include: A) Face the speaker directly. Optimal is having the speakers face well - lit.  Unless amplified, being within 3-6 feet of the speaker will enhance word recognition. B) Avoid having the speaker back-lit as this will minimize the ability to use cues from lip-reading, facial expression and gestures. C)  Word recognition is poorer in background noise. For optimal word recognition, turn off the TV, radio or noisy fan when engaging in conversation. In a restaurant, try to sit away from noise sources and close to the primary speaker.  D)  Ask for topic clarification from time to time in order to remain in the conversation.  Most people don't mind repeating or clarifying a point when asked.  If needed, explain the difficulty hearing in background noise or hearing loss. 6.   Use hearing protection during noisy activities such as using a weed eater, moving the lawn, shooting, etc.    Musician's plugs, are available from [employer] for music related hearing protection because there is no distortion.  Other hearing protection, such as sponge plugs (available at pharmacies) or earmuffs (available at sporting goods stores or department stores such as Paediatric nurse) are useful for noisy activities and venues.   Dorothymae Maciver L. Heide Spark, Au.D., CCC-A Doctor of Audiology 07/04/2017  cc: Sherene Sires, DO

## 2017-09-19 ENCOUNTER — Encounter: Payer: Self-pay | Admitting: Family Medicine

## 2017-09-19 ENCOUNTER — Ambulatory Visit (INDEPENDENT_AMBULATORY_CARE_PROVIDER_SITE_OTHER): Payer: Medicare HMO | Admitting: Family Medicine

## 2017-09-19 ENCOUNTER — Other Ambulatory Visit: Payer: Self-pay

## 2017-09-19 VITALS — BP 118/82 | HR 80 | Temp 98.0°F | Ht 74.0 in | Wt 232.8 lb

## 2017-09-19 DIAGNOSIS — R609 Edema, unspecified: Secondary | ICD-10-CM | POA: Insufficient documentation

## 2017-09-19 DIAGNOSIS — N521 Erectile dysfunction due to diseases classified elsewhere: Secondary | ICD-10-CM | POA: Diagnosis not present

## 2017-09-19 MED ORDER — VARDENAFIL HCL 20 MG PO TABS: 20.0000 mg | ORAL_TABLET | Freq: Every day | ORAL | 1 refills | Status: DC | PRN

## 2017-09-19 NOTE — Assessment & Plan Note (Signed)
Even though this is a symmetrical I still think this is dependent edema.  We will start with conservative approach of using support hose.  Emphasized the need to put those on first thing in the morning.  Also check creatinine today at that has not been checked in about 6 months.  Explained this to him and his wife and answered all her questions spending greater than 50% of our 25-minute office visit counseling education and coordination of care giving him information on where to buy support.

## 2017-09-19 NOTE — Assessment & Plan Note (Signed)
He had previously been prescribed Levitra.  He cannot recall whether it helped or not, in fact he cannot recall whether or not he ever took the medication.  He and his wife would like to try that medicine so we will give them a prescription.  They will follow-up with her regular provider regarding this and the other issue in 2 to 3 weeks.

## 2017-09-19 NOTE — Patient Instructions (Signed)
I recommend you try using support hose for the left leg swelling.  You can buy them over-the-counter or you can go to the website [email or web address].  I am going to get some blood work today to check your kidney function.  I will also refill your Levitra.  Please schedule follow-up appointment with your regular provider Dr. Criss Rosales in the next 2 to 3 weeks for follow-up.  Great to meet you!

## 2017-09-19 NOTE — Progress Notes (Addendum)
    CHIEF COMPLAINT / HPI: Swelling of left foot and ankle for the last 2 weeks.  Gets worse during the day improved some overnight.  Having some slight swelling of the right foot but not nearly as much as the left.  Has not had this previously.  No calf pain.  No calf redness or increased warmth.  No shortness of breath.  No new medicines, in fact he is not taking any medicines but vitamins.  He is here with his wife.   Caveat: Some of the history is taken from his wife secondary to his cognitive decline.  REVIEW OF SYSTEMS: See HPI.  Additional pertinent review of systems is negative for fever, no arthralgias, no headache, no skin rash.  PERTINENT  PMH / PSH: I have reviewed the patient's medications, allergies, past medical and surgical history, smoking status and updated in the EMR as appropriate. History of prostatectomy secondary to prostate cancer  OBJECTIVE: GENERAL: Well-developed male no acute distress CV: Regular rate and rhythm RESPIRATORY: Normal work of breathing EXTREMITY: Trace nonpitting edema of the right ankle and slightly increased but still nonpitting edema of the left foot and ankle.  It does not progress up into the calf or shin.  The calf is nontender.   SKIN: There is no increased erythema or unusual warmth of the calf.  No macules or papules or other skin lesions noted.  He has some mild hyperpigmentation that appears to be chronic on the edges of his malleoli ANKLE: Intact range of motion bilateral ankles dorsiflexion plantarflexion.  Motion is nontender.    ASSESSMENT / PLAN:  Edema Even though this is a symmetrical I still think this is dependent edema.  We will start with conservative approach of using support hose.  Emphasized the need to put those on first thing in the morning.  Also check creatinine today at that has not been checked in about 6 months.  Explained this to him and his wife and answered all her questions spending greater than 50% of our 25-minute  office visit counseling education and coordination of care giving him information on where to buy support.  Erectile dysfunction due to diseases classified elsewhere He had previously been prescribed Levitra.  He cannot recall whether it helped or not, in fact he cannot recall whether or not he ever took the medication.  He and his wife would like to try that medicine so we will give them a prescription.  They will follow-up with her regular provider regarding this and the other issue in 2 to 3 weeks.

## 2017-09-20 ENCOUNTER — Encounter: Payer: Self-pay | Admitting: Family Medicine

## 2017-09-20 LAB — BASIC METABOLIC PANEL
BUN/Creatinine Ratio: 9 — ABNORMAL LOW (ref 10–24)
BUN: 11 mg/dL (ref 8–27)
CO2: 19 mmol/L — ABNORMAL LOW (ref 20–29)
Calcium: 9.3 mg/dL (ref 8.6–10.2)
Chloride: 108 mmol/L — ABNORMAL HIGH (ref 96–106)
Creatinine, Ser: 1.2 mg/dL (ref 0.76–1.27)
GFR calc Af Amer: 71 mL/min/{1.73_m2} (ref >59)
GFR calc non Af Amer: 61 mL/min/{1.73_m2} (ref >59)
Glucose: 94 mg/dL (ref 65–99)
Potassium: 4.3 mmol/L (ref 3.5–5.2)
Sodium: 143 mmol/L (ref 134–144)

## 2017-10-12 ENCOUNTER — Ambulatory Visit: Payer: Medicare HMO | Admitting: Family Medicine

## 2018-06-06 IMAGING — DX DG CHEST 2V
2 series · 2 of 2 positions shown · non-contrast
Comparison: None.

CLINICAL DATA: Productive cough, fever

EXAM:
CHEST  2 VIEW

[chest pa]
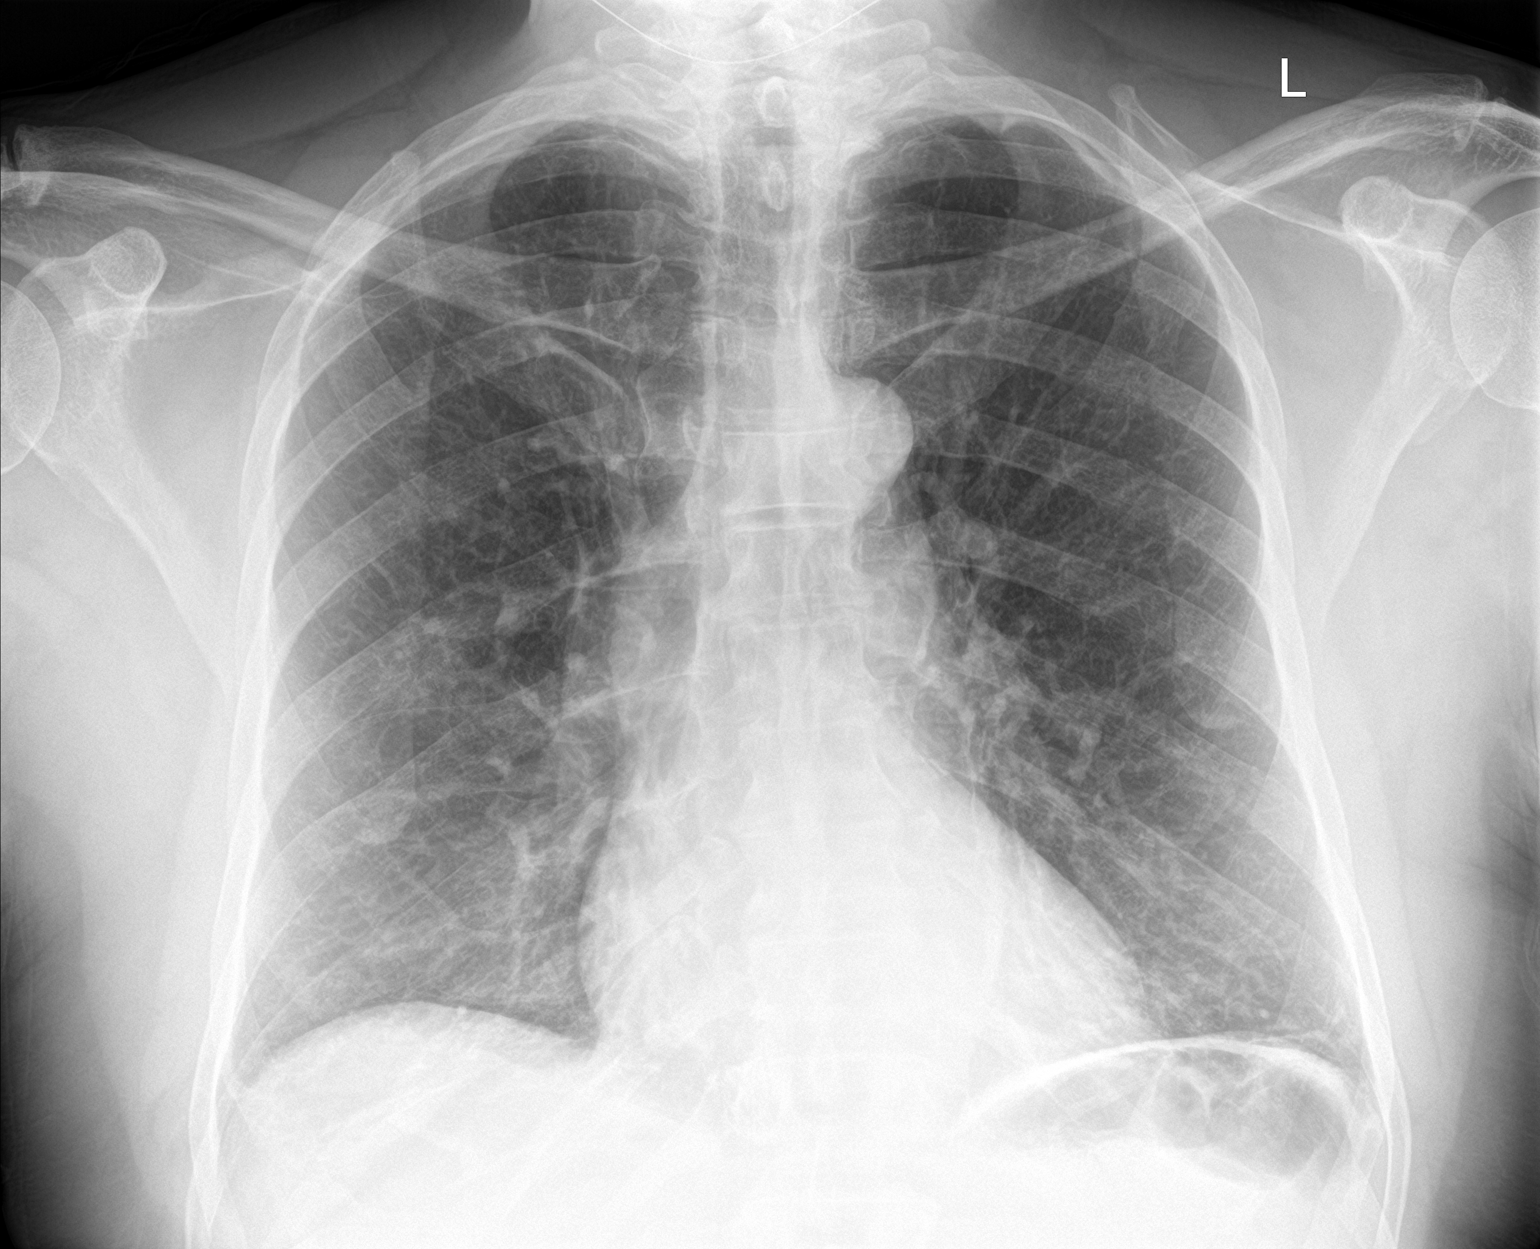

[chest lat]
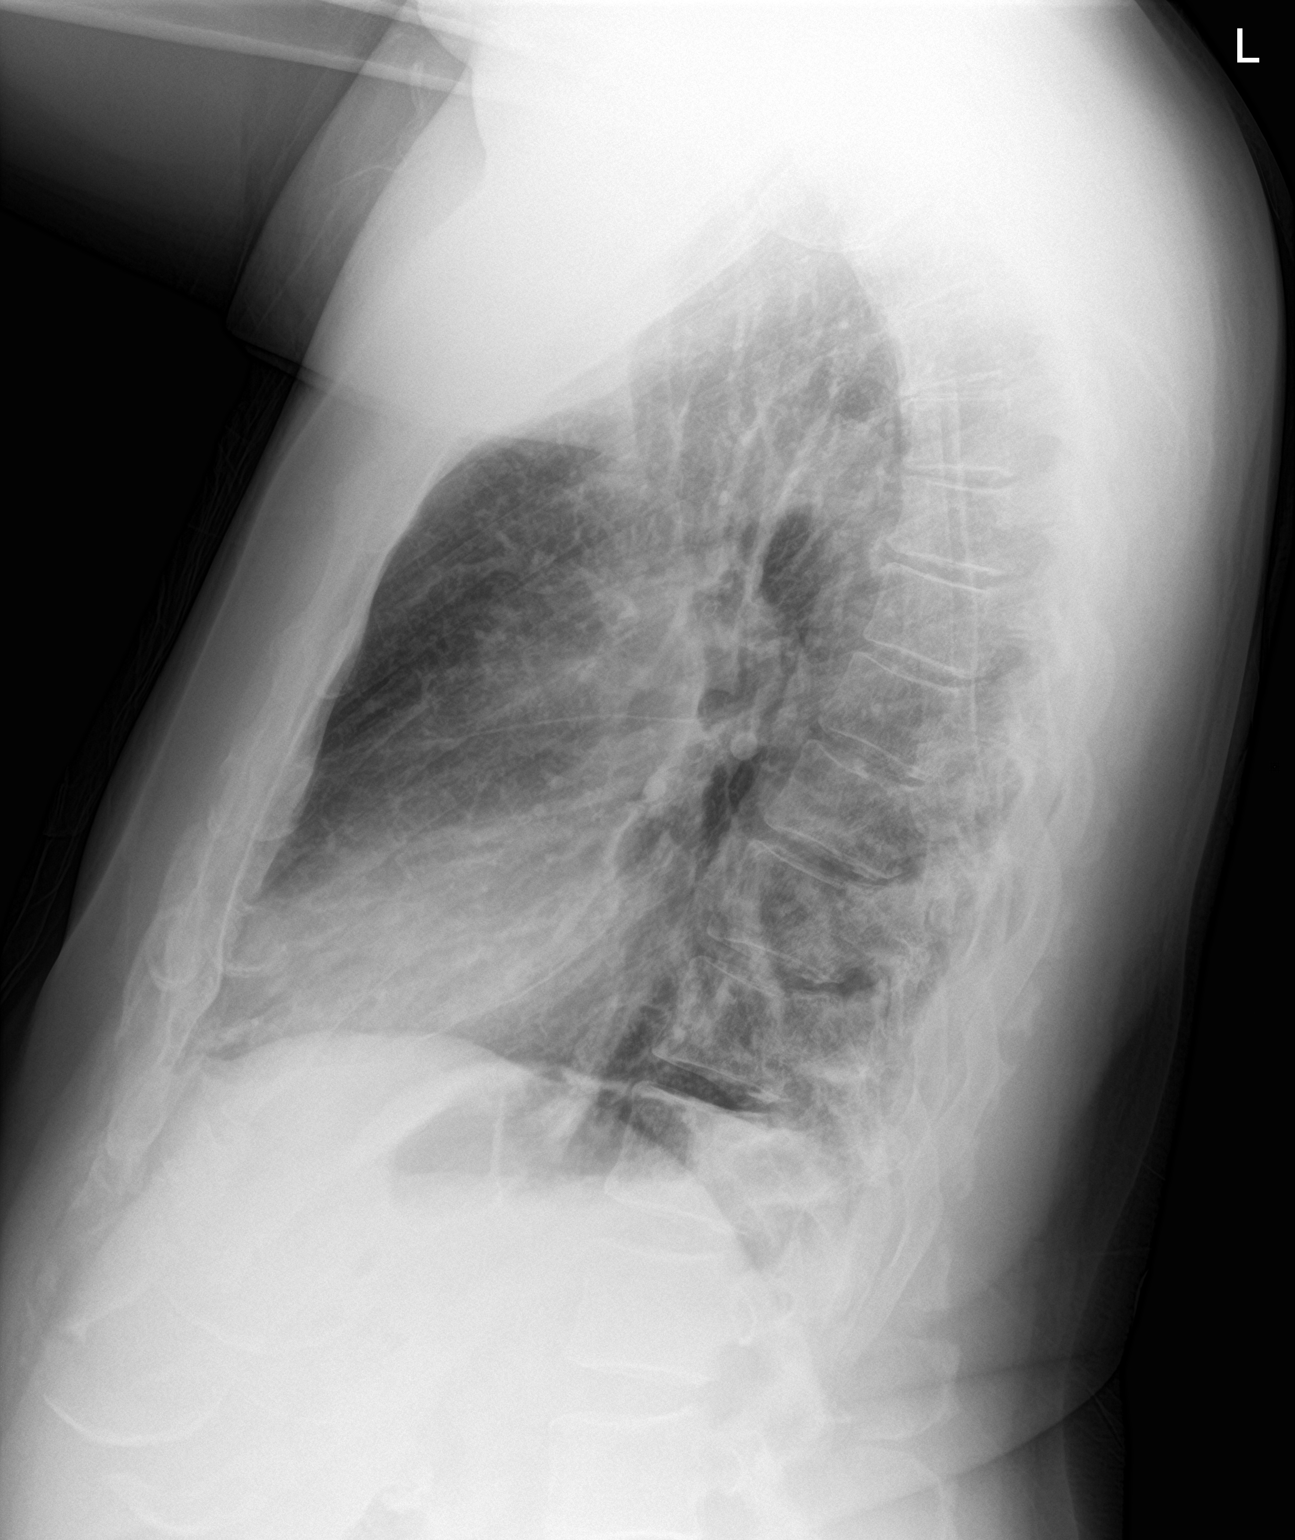

[2 of 2 positions shown; findings below may reference images not displayed]

FINDINGS: Cardiomediastinal silhouette is unremarkable. No infiltrate or
pulmonary edema. Mild infrahilar bronchitic changes. Mild
degenerative changes mid thoracic spine.
IMPRESSION: No infiltrate or pulmonary edema. Mild infrahilar bronchitic
changes.

## 2018-08-19 DIAGNOSIS — N433 Hydrocele, unspecified: Secondary | ICD-10-CM | POA: Diagnosis not present

## 2018-08-19 DIAGNOSIS — N393 Stress incontinence (female) (male): Secondary | ICD-10-CM | POA: Diagnosis not present

## 2018-08-19 DIAGNOSIS — N5201 Erectile dysfunction due to arterial insufficiency: Secondary | ICD-10-CM | POA: Diagnosis not present

## 2018-08-19 DIAGNOSIS — C61 Malignant neoplasm of prostate: Secondary | ICD-10-CM | POA: Diagnosis not present

## 2018-12-31 ENCOUNTER — Ambulatory Visit (INDEPENDENT_AMBULATORY_CARE_PROVIDER_SITE_OTHER): Payer: Medicare HMO

## 2018-12-31 ENCOUNTER — Other Ambulatory Visit: Payer: Self-pay

## 2018-12-31 DIAGNOSIS — Z23 Encounter for immunization: Secondary | ICD-10-CM | POA: Diagnosis not present

## 2018-12-31 NOTE — Progress Notes (Signed)
Patient presents in nurse clinic for flu vaccine. Vaccine given LD, site unremarkable.

## 2019-06-20 ENCOUNTER — Other Ambulatory Visit: Payer: Self-pay

## 2019-06-20 ENCOUNTER — Emergency Department (HOSPITAL_COMMUNITY): Payer: Medicare HMO

## 2019-06-20 ENCOUNTER — Encounter (HOSPITAL_COMMUNITY): Payer: Self-pay | Admitting: Emergency Medicine

## 2019-06-20 ENCOUNTER — Emergency Department (HOSPITAL_COMMUNITY)
Admission: EM | Admit: 2019-06-20 | Discharge: 2019-06-20 | Disposition: A | Discharge status: Home / Self Care | Payer: Medicare HMO | Attending: Emergency Medicine | Admitting: Emergency Medicine

## 2019-06-20 DIAGNOSIS — Z79899 Other long term (current) drug therapy: Secondary | ICD-10-CM | POA: Insufficient documentation

## 2019-06-20 DIAGNOSIS — R109 Unspecified abdominal pain: Secondary | ICD-10-CM | POA: Diagnosis not present

## 2019-06-20 DIAGNOSIS — K5732 Diverticulitis of large intestine without perforation or abscess without bleeding: Secondary | ICD-10-CM

## 2019-06-20 LAB — COMPREHENSIVE METABOLIC PANEL
ALT: 28 U/L (ref 0–44)
AST: 24 U/L (ref 15–41)
Albumin: 3.7 g/dL (ref 3.5–5.0)
Alkaline Phosphatase: 91 U/L (ref 38–126)
Anion gap: 9 (ref 5–15)
BUN: 8 mg/dL (ref 8–23)
CO2: 23 mmol/L (ref 22–32)
Calcium: 9 mg/dL (ref 8.9–10.3)
Chloride: 107 mmol/L (ref 98–111)
Creatinine, Ser: 1.29 mg/dL — ABNORMAL HIGH (ref 0.61–1.24)
GFR calc Af Amer: >60 mL/min (ref >60)
GFR calc non Af Amer: 56 mL/min — ABNORMAL LOW (ref >60)
Glucose, Bld: 160 mg/dL — ABNORMAL HIGH (ref 70–99)
Potassium: 3.6 mmol/L (ref 3.5–5.1)
Sodium: 139 mmol/L (ref 135–145)
Total Bilirubin: 1.3 mg/dL — ABNORMAL HIGH (ref 0.3–1.2)
Total Protein: 7.4 g/dL (ref 6.5–8.1)

## 2019-06-20 LAB — URINALYSIS, ROUTINE W REFLEX MICROSCOPIC
Bacteria, UA: NONE SEEN
Bilirubin Urine: NEGATIVE
Glucose, UA: NEGATIVE mg/dL
Ketones, ur: NEGATIVE mg/dL
Leukocytes,Ua: NEGATIVE
Nitrite: NEGATIVE
Protein, ur: NEGATIVE mg/dL
Specific Gravity, Urine: 1.013 (ref 1.005–1.030)
pH: 6 (ref 5.0–8.0)

## 2019-06-20 LAB — CBC
HCT: 48.7 % (ref 39.0–52.0)
Hemoglobin: 16.2 g/dL (ref 13.0–17.0)
MCH: 30.1 pg (ref 26.0–34.0)
MCHC: 33.3 g/dL (ref 30.0–36.0)
MCV: 90.5 fL (ref 80.0–100.0)
Platelets: 189 10*3/uL (ref 150–400)
RBC: 5.38 MIL/uL (ref 4.22–5.81)
RDW: 14.2 % (ref 11.5–15.5)
WBC: 12 10*3/uL — ABNORMAL HIGH (ref 4.0–10.5)
nRBC: 0 % (ref 0.0–0.2)

## 2019-06-20 LAB — LIPASE, BLOOD: Lipase: 19 U/L (ref 11–51)

## 2019-06-20 MED ORDER — IOHEXOL 300 MG/ML  SOLN
100.0000 mL | Freq: Once | INTRAMUSCULAR | Status: AC | PRN
  Administered 2019-06-20: 100 mL via INTRAVENOUS

## 2019-06-20 MED ORDER — CIPROFLOXACIN HCL 500 MG PO TABS: 500.0000 mg | ORAL_TABLET | Freq: Once | ORAL | Status: DC

## 2019-06-20 MED ORDER — METRONIDAZOLE 500 MG PO TABS: 500.0000 mg | ORAL_TABLET | Freq: Once | ORAL | Status: DC

## 2019-06-20 MED ORDER — SODIUM CHLORIDE 0.9% FLUSH
3.0000 mL | Freq: Once | INTRAVENOUS | Status: AC
  Administered 2019-06-20: 3 mL via INTRAVENOUS

## 2019-06-20 MED ORDER — METRONIDAZOLE 500 MG PO TABS: 500.0000 mg | ORAL_TABLET | Freq: Two times a day (BID) | ORAL | 0 refills | Status: DC

## 2019-06-20 MED ORDER — CIPROFLOXACIN HCL 500 MG PO TABS: 500.0000 mg | ORAL_TABLET | Freq: Two times a day (BID) | ORAL | 0 refills | Status: DC

## 2019-06-20 NOTE — Discharge Instructions (Signed)
Take the antibiotic prescription that directed.  Follow directions for dietary control of diverticulitis and diverticulosis, attached to this document.  Drink plenty of fluids.  Use Tylenol if needed for fever or pain.  See your doctor for checkup in a week or 2.

## 2019-06-20 NOTE — ED Provider Notes (Signed)
[facility] EMERGENCY DEPARTMENT Provider Note   CSN: [id number] Arrival date & time: 06/20/19  0025     History Chief Complaint  Patient presents with  . Abdominal Pain    Tyrone Kline is a 69 y.o. male.  HPI Patient presents for evaluation abdominal pain.  He has a history of prostate cancer treated with prostatectomy.  He reports having crampy abdominal pain lower abdomen, bilaterally for 2 days.  The pain comes and goes.  He denies difficulty voiding including urinary frequency, dysuria or hematuria.  He has not had any constipation or diarrhea.  He denies fever, chills, cough, shortness of breath, weakness or dizziness.  He has had 2 Covid vaccines.  There are no other known modifying factors.    Past Medical History:  Diagnosis Date  . Cancer [facility])    PROSTATE CANCER  . Encounter to establish care 04/05/2017  . Sleep apnea 10/06/11   STOP BANG SCORE OF 4    Patient Active Problem List   Diagnosis Date Noted  . Edema 09/19/2017  . Erectile dysfunction due to diseases classified elsewhere 09/19/2017  . History of prostate cancer 04/05/2017  . Hearing problem of both ears 04/05/2017  . Fungal infection of toenail 04/05/2017  . Undiagnosed cardiac murmurs 04/05/2017  . Pre-diabetes 04/05/2017  . Cognitive decline 10/22/2012    Past Surgical History:  Procedure Laterality Date  . ROBOT ASSISTED LAPAROSCOPIC RADICAL PROSTATECTOMY  10/11/2011   Procedure: ROBOTIC ASSISTED LAPAROSCOPIC RADICAL PROSTATECTOMY;  Surgeon: Bernestine Amass, MD;  Location: [facility];  Service: Urology;  Laterality: N/A;  Robotic Assisted Laparoscopic Radical Retropubic Prostatectomy with Bilateral Pelvic Lymph Node Dissection    . SURGERY ON RIGHT INDEX FINGER         No family history on file.  Social History   Tobacco Use  . Smoking status: Never Smoker  . Smokeless tobacco: Never Used  Substance Use Topics  . Alcohol use: No  . Drug use: No    Home  Medications Prior to Admission medications   Medication Sig Start Date End Date Taking? Authorizing Provider  vardenafil (LEVITRA) 20 MG tablet Take 1 tablet (20 mg total) by mouth daily as needed for erectile dysfunction. 09/19/17   Dickie La, MD    Allergies    Patient has no known allergies.  Review of Systems   Review of Systems  All other systems reviewed and are negative.   Physical Exam Updated Vital Signs BP (!) 151/87 (BP Location: Right Arm)   Pulse 86   Temp 98.2 F (36.8 C)   Resp 16   Ht '6\' 1"'$  (1.854 m)   Wt 102.1 kg   SpO2 99%   BMI 29.69 kg/m   Physical Exam Vitals and nursing note reviewed.  Constitutional:      General: He is not in acute distress.    Appearance: He is well-developed. He is not ill-appearing, toxic-appearing or diaphoretic.  HENT:     Head: Normocephalic and atraumatic.     Right Ear: External ear normal.     Left Ear: External ear normal.  Eyes:     Conjunctiva/sclera: Conjunctivae normal.     Pupils: Pupils are equal, round, and reactive to light.  Neck:     Trachea: Phonation normal.  Cardiovascular:     Rate and Rhythm: Normal rate and regular rhythm.     Heart sounds: Normal heart sounds.  Pulmonary:     Effort: Pulmonary effort is normal. No respiratory distress.  Breath sounds: Normal breath sounds. No stridor.  Abdominal:     General: There is no distension.     Palpations: Abdomen is soft.     Tenderness: There is no abdominal tenderness. There is no guarding.  Musculoskeletal:        General: Normal range of motion.     Cervical back: Normal range of motion and neck supple.  Skin:    General: Skin is warm and dry.  Neurological:     Mental Status: He is alert and oriented to person, place, and time.     Cranial Nerves: No cranial nerve deficit.     Sensory: No sensory deficit.     Motor: No abnormal muscle tone.     Coordination: Coordination normal.  Psychiatric:        Mood and Affect: Mood normal.         Behavior: Behavior normal.        Thought Content: Thought content normal.        Judgment: Judgment normal.     ED Results / Procedures / Treatments   Labs (all labs ordered are listed, but only abnormal results are displayed) Labs Reviewed  COMPREHENSIVE METABOLIC PANEL - Abnormal; Notable for the following components:      Result Value   Glucose, Bld 160 (*)    Creatinine, Ser 1.29 (*)    Total Bilirubin 1.3 (*)    GFR calc non Af Amer 56 (*)    All other components within normal limits  CBC - Abnormal; Notable for the following components:   WBC 12.0 (*)    All other components within normal limits  URINALYSIS, ROUTINE W REFLEX MICROSCOPIC - Abnormal; Notable for the following components:   Hgb urine dipstick SMALL (*)    All other components within normal limits  LIPASE, BLOOD    EKG None  Radiology No results found.  Procedures Procedures (including critical care time)  Medications Ordered in ED Medications  sodium chloride flush (NS) 0.9 % injection 3 mL (has no administration in time range)    ED Course  I have reviewed the triage vital signs and the nursing notes.  Pertinent labs & imaging results that were available during my care of the patient were reviewed by me and considered in my medical decision making (see chart for details).  Clinical Course as of Jun 19 820  Fri Jun 20, 2019  0818 Normal except glucose high, creatinine high, total bilirubin high, GFR low  Comprehensive metabolic panel(!) [EW]  7096 Normal  Lipase, blood [EW]  0818 Normal except white count high  CBC(!) [EW]  0818 Normal  Urinalysis, Routine w reflex microscopic(!) [EW]    Clinical Course User Index [EW] Daleen Bo, MD   MDM Rules/Calculators/A&P                       Patient Vitals for the past 24 hrs:  BP Temp Temp src Pulse Resp SpO2 Height Weight  06/20/19 0746 (!) 151/87 98.2 F (36.8 C) -- 86 16 99 % '6\' 1"'$  (1.854 m) 102.1 kg  06/20/19 0533 (!) 162/89  -- -- 89 15 95 % -- --  06/20/19 0034 (!) 157/102 99.2 F (37.3 C) Oral (!) 104 16 95 % '6\' 1"'$  (1.854 m) 102.1 kg    At D/C- Reevaluation with update and discussion. After initial assessment and treatment, an updated evaluation reveals he is comfortable. Findings discussed and questions answered. [place]  Decision Making:  This patient is presenting for evaluation of abdominal pain, which does require a range of treatment options, and is a complaint that involves a moderate risk of morbidity and mortality. The differential diagnoses include intestinal infection, urinary tract infection, urinary bladder outlet dysfunction. I decided  to review old records, and in summary relatively healthy elderly man with history of prostate cancer treated with prostatectomy, 14 years ago. I obtained additional historical information from his wife at the bedside. Clinical Laboratory Tests Ordered, included CBC, c-Met, urinalysis.  Results reviewed, normal except mild elevation white blood cell count Radiologic Tests Ordered, included CT abdomen pelvis with contrast. I independently Visualized: CT Images, which show diverticulitis.    Critical Interventions- Clinical, and diagnostic evaluations  After These Interventions, the Patient was reevaluated and was found with acute diverticular infection, uncomplicated. He is stable for d/c.  CRITICAL CARE- No Performed by: Daleen Bo   Nursing Notes Reviewed/ Care Coordinated Applicable Imaging Reviewed Interpretation of Laboratory Data incorporated into ED treatment  The patient appears reasonably screened and/or stabilized for discharge and I doubt any other medical condition or other [facility] requiring further screening, evaluation, or treatment in the ED at this time prior to discharge.  Plan: Home Medications- usual, APAP prn; Home Treatments- gradually advance diet; return here if the recommended treatment, does not improve the symptoms;  Recommended follow up- PCP 1 week.   Final Clinical Impression(s) / ED Diagnoses Final diagnoses:  Diverticulitis of large intestine without perforation or abscess without bleeding    Rx / DC Orders ED Discharge Orders         Ordered    metroNIDAZOLE (FLAGYL) 500 MG tablet  2 times daily     06/20/19 1050    ciprofloxacin (CIPRO) 500 MG tablet  2 times daily     06/20/19 1050           Daleen Bo, MD 06/21/19 1640

## 2019-06-20 NOTE — ED Triage Notes (Signed)
Pt c/o 6/10 abd pain since yesterday, no nausea vomiting or diarrhea.

## 2019-07-14 ENCOUNTER — Ambulatory Visit: Payer: Medicare HMO | Admitting: Family Medicine

## 2019-07-21 ENCOUNTER — Other Ambulatory Visit: Payer: Self-pay

## 2019-07-21 ENCOUNTER — Ambulatory Visit (INDEPENDENT_AMBULATORY_CARE_PROVIDER_SITE_OTHER): Payer: Medicare HMO | Admitting: Family Medicine

## 2019-07-21 ENCOUNTER — Encounter: Payer: Self-pay | Admitting: Family Medicine

## 2019-07-21 VITALS — BP 122/80 | HR 75 | Ht 73.0 in | Wt 239.0 lb

## 2019-07-21 DIAGNOSIS — N521 Erectile dysfunction due to diseases classified elsewhere: Secondary | ICD-10-CM | POA: Diagnosis not present

## 2019-07-21 DIAGNOSIS — K5790 Diverticulosis of intestine, part unspecified, without perforation or abscess without bleeding: Secondary | ICD-10-CM | POA: Diagnosis not present

## 2019-07-21 MED ORDER — SILDENAFIL CITRATE 25 MG PO TABS: 25.0000 mg | ORAL_TABLET | Freq: Every day | ORAL | 0 refills | Status: DC | PRN

## 2019-07-21 NOTE — Patient Instructions (Addendum)
If you have an erection that lasts longer than 3 hours or if you start getting any chest pain while using this medication, please get to a physician immediately.  Sildenafil tablets (Erectile Dysfunction) What is this medicine? SILDENAFIL (sil DEN a fil) is used to treat erection problems in men. This medicine may be used for other purposes; ask your health care provider or pharmacist if you have questions. COMMON BRAND NAME(S): Viagra What should I tell my health care provider before I take this medicine? They need to know if you have any of these conditions:  bleeding disorders  eye or vision problems, including a rare inherited eye disease called retinitis pigmentosa  anatomical deformation of the penis, Peyronie's disease, or history of priapism (painful and prolonged erection)  heart disease, angina, a history of heart attack, irregular heart beats, or other heart problems  high or low blood pressure  history of blood diseases, like sickle cell anemia or leukemia  history of stomach bleeding  kidney disease  liver disease  stroke  an unusual or allergic reaction to sildenafil, other medicines, foods, dyes, or preservatives  pregnant or trying to get pregnant  breast-feeding How should I use this medicine? Take this medicine by mouth with a glass of water. Follow the directions on the prescription label. The dose is usually taken 1 hour before sexual activity. You should not take the dose more than once per day. Do not take your medicine more often than directed. Talk to your pediatrician regarding the use of this medicine in children. This medicine is not used in children for this condition. Overdosage: If you think you have taken too much of this medicine contact a poison control center or emergency room at once. NOTE: This medicine is only for you. Do not share this medicine with others. What if I miss a dose? This does not apply. Do not take double or extra  doses. What may interact with this medicine? Do not take this medicine with any of the following medications:  cisapride  nitrates like amyl nitrite, isosorbide dinitrate, isosorbide mononitrate, nitroglycerin  riociguat This medicine may also interact with the following medications:  antiviral medicines for HIV or AIDS  bosentan  certain medicines for benign prostatic hyperplasia (BPH)  certain medicines for blood pressure  certain medicines for fungal infections like ketoconazole and itraconazole  cimetidine  erythromycin  rifampin This list may not describe all possible interactions. Give your health care provider a list of all the medicines, herbs, non-prescription drugs, or dietary supplements you use. Also tell them if you smoke, drink alcohol, or use illegal drugs. Some items may interact with your medicine. What should I watch for while using this medicine? If you notice any changes in your vision while taking this drug, call your doctor or health care professional as soon as possible. Stop using this medicine and call your health care provider right away if you have a loss of sight in one or both eyes. Contact your doctor or health care professional right away if you have an erection that lasts longer than 4 hours or if it becomes painful. This may be a sign of a serious problem and must be treated right away to prevent permanent damage. If you experience symptoms of nausea, dizziness, chest pain or arm pain upon initiation of sexual activity after taking this medicine, you should refrain from further activity and call your doctor or health care professional as soon as possible. Do not drink alcohol to excess (examples,  5 glasses of wine or 5 shots of whiskey) when taking this medicine. When taken in excess, alcohol can increase your chances of getting a headache or getting dizzy, increasing your heart rate or lowering your blood pressure. Using this medicine does not protect  you or your partner against HIV infection (the virus that causes AIDS) or other sexually transmitted diseases. What side effects may I notice from receiving this medicine? Side effects that you should report to your doctor or health care professional as soon as possible:  allergic reactions like skin rash, itching or hives, swelling of the face, lips, or tongue  breathing problems  changes in hearing  changes in vision  chest pain  fast, irregular heartbeat  prolonged or painful erection  seizures Side effects that usually do not require medical attention (report to your doctor or health care professional if they continue or are bothersome):  back pain  dizziness  flushing  headache  indigestion  muscle aches  nausea  stuffy or runny nose This list may not describe all possible side effects. Call your doctor for medical advice about side effects. You may report side effects to FDA at 1-800-FDA-1088. Where should I keep my medicine? Keep out of reach of children. Store at room temperature between 15 and 30 degrees C (59 and 86 degrees F). Throw away any unused medicine after the expiration date. NOTE: This sheet is a summary. It may not cover all possible information. If you have questions about this medicine, talk to your doctor, pharmacist, or health care provider.  2020 Elsevier/Gold Standard (2015-02-03 12:00:25)

## 2019-07-22 DIAGNOSIS — K5790 Diverticulosis of intestine, part unspecified, without perforation or abscess without bleeding: Secondary | ICD-10-CM | POA: Insufficient documentation

## 2019-07-22 NOTE — Assessment & Plan Note (Signed)
Recent emergency department visit last month for noncomplicated diverticulitis.  Patient completed medication as prescribed and had complete resolution of symptoms.  He feels fine and does not have any complaints.

## 2019-07-22 NOTE — Progress Notes (Signed)
    SUBJECTIVE:   CHIEF COMPLAINT / HPI:   Erectile dysfunction due to diseases classified elsewhere Status post prostatectomy, patient still with desire for sex relations but has not been able to obtain an erection.  He said that he thought he had tried a pill in the past documentation does not seem to say whether it was successful or not, patient says he does not particular member because it is been so long.  We discussed risks versus benefits and concerns for heart complications or priapism.  Patient will try 25 mg sildenafil and will check back and to let us know if this works.  We discussed there are other options at the urologist can discuss with him if he wants including implants or shots, patient is not interested that   Diverticulosis Recent emergency department visit last month for noncomplicated diverticulitis.  Patient completed medication as prescribed and had complete resolution of symptoms.  He feels fine and does not have any complaints.   PERTINENT  PMH / PSH:   OBJECTIVE:   BP 122/80   Pulse 75   Ht 6\' 1"  (1.854 m)   Wt 239 lb (108.4 kg)   SpO2 98%   BMI 31.53 kg/m   General: Alert pleasant, no distress Cardiac: Regular rate and rhythm Respiratory: Clear to auscultation, no increased work of breathing, no cough, no wheeze  ASSESSMENT/PLAN:   Erectile dysfunction due to diseases classified elsewhere Status post prostatectomy, patient still with desire for sex relations but has not been able to obtain an erection.  He said that he thought he had tried a pill in the past documentation does not seem to say whether it was successful or not, patient says he does not particular member because it is been so long.  We discussed risks versus benefits and concerns for heart complications or priapism.  Patient will try 25 mg sildenafil and will check back and to let us know if this works.  We discussed there are other options at the urologist can discuss with him if he wants  including implants or shots, patient is not interested that  Diverticulosis Recent emergency department visit last month for noncomplicated diverticulitis.  Patient completed medication as prescribed and had complete resolution of symptoms.  He feels fine and does not have any complaints.     Sherene Sires, [place]

## 2019-07-22 NOTE — Assessment & Plan Note (Signed)
Status post prostatectomy, patient still with desire for sex relations but has not been able to obtain an erection.  He said that he thought he had tried a pill in the past documentation does not seem to say whether it was successful or not, patient says he does not particular member because it is been so long.  We discussed risks versus benefits and concerns for heart complications or priapism.  Patient will try 25 mg sildenafil and will check back and to let us know if this works.  We discussed there are other options at the urologist can discuss with him if he wants including implants or shots, patient is not interested that

## 2019-08-15 DIAGNOSIS — R69 Illness, unspecified: Secondary | ICD-10-CM | POA: Diagnosis not present

## 2019-09-03 DIAGNOSIS — C61 Malignant neoplasm of prostate: Secondary | ICD-10-CM | POA: Diagnosis not present

## 2019-09-09 ENCOUNTER — Encounter: Payer: Self-pay | Admitting: Family Medicine

## 2019-09-09 ENCOUNTER — Ambulatory Visit (INDEPENDENT_AMBULATORY_CARE_PROVIDER_SITE_OTHER): Payer: Medicare HMO | Admitting: Family Medicine

## 2019-09-09 ENCOUNTER — Other Ambulatory Visit: Payer: Self-pay

## 2019-09-09 VITALS — BP 130/82 | HR 84 | Ht 73.0 in | Wt 242.0 lb

## 2019-09-09 DIAGNOSIS — K625 Hemorrhage of anus and rectum: Secondary | ICD-10-CM | POA: Insufficient documentation

## 2019-09-09 DIAGNOSIS — N433 Hydrocele, unspecified: Secondary | ICD-10-CM | POA: Diagnosis not present

## 2019-09-09 DIAGNOSIS — K5792 Diverticulitis of intestine, part unspecified, without perforation or abscess without bleeding: Secondary | ICD-10-CM | POA: Diagnosis not present

## 2019-09-09 DIAGNOSIS — Z1211 Encounter for screening for malignant neoplasm of colon: Secondary | ICD-10-CM

## 2019-09-09 DIAGNOSIS — N393 Stress incontinence (female) (male): Secondary | ICD-10-CM | POA: Diagnosis not present

## 2019-09-09 DIAGNOSIS — C61 Malignant neoplasm of prostate: Secondary | ICD-10-CM | POA: Diagnosis not present

## 2019-09-09 DIAGNOSIS — N5201 Erectile dysfunction due to arterial insufficiency: Secondary | ICD-10-CM | POA: Diagnosis not present

## 2019-09-09 LAB — HEMOCCULT GUIAC POC 1CARD (OFFICE): Fecal Occult Blood, POC: POSITIVE — AB

## 2019-09-09 LAB — POCT HEMOGLOBIN: Hemoglobin: 15 g/dL — AB (ref 11–14.6)

## 2019-09-09 MED ORDER — POLYETHYLENE GLYCOL 3350 17 GM/SCOOP PO POWD: 17.0000 g | Freq: Two times a day (BID) | ORAL | 1 refills | Status: DC | PRN

## 2019-09-09 NOTE — Assessment & Plan Note (Signed)
He completed his antibiotic course and reports that her symptoms have entirely resolved. -Follow-up BMP to assess kidney function (mildly elevated in the ED during his episode of diverticulitis) -Follow-up colonoscopy

## 2019-09-09 NOTE — Assessment & Plan Note (Signed)
His rectal bleeding does sound consistent with a possible rectal fissure although he does deny any recent constipation.  The differential also includes hemorrhoids, friable rectal tissue related to his prostate cancer which could be bleeding.  Diverticulosis, colon cancer.  He is 69 years old and has never had a colonoscopy and needs one for age-appropriate cancer screening.  He is also due for a colonoscopy based on his episode of diverticulitis in April. -Ambulatory referral to gastroenterology for colonoscopy

## 2019-09-09 NOTE — Patient Instructions (Addendum)
Rectal bleeding: I think this is very likely from an anal fissure.  I think it is necessary for you to get a colonoscopy at this time for appropriate colon cancer screening and to make sure there is nothing else causing rectal bleeding at this time.  This is also the standard of care following diverticulitis.  If you are having any hard stools, I recommend taking MiraLAX to help soften your stools and prevent further pain from rectal fissures/tears.  I do want a get some blood work for you to make sure that you are kidney function is gotten better since last time in the hospital.  I will let you know if there is any concerning findings.

## 2019-09-09 NOTE — Progress Notes (Signed)
    SUBJECTIVE:   CHIEF COMPLAINT / HPI:   Bright red blood per rectum Tyrone Kline notes that he first started having rectal bleeding about 1 week ago.  He has not had any rectal bleeding in the past 2 days.  This seemed to be characterized by painful bowel movements and streaks of blood on his stool.  He notes only small amounts of blood in the toilet, on his stool or on the toilet paper.  Is not felt lightheaded or had any abdominal pain.  He denies any hematemesis.  He denies black tarry stools.  It appears that he has never had a colonoscopy before.  He is under the impression that he has had a colonoscopy and we discussed the procedure and his past medical history.  After discussing the procedure, he was not positive he has previously had a colonoscopy.  We were able to find evidence of his prostate cancer and procedures related to that but were not able to find evidence of colonoscopy.  PERTINENT  PMH / PSH: Prostate cancer in remission  OBJECTIVE:   BP 130/82   Pulse 84   Ht 6\' 1"  (1.854 m)   Wt 242 lb (109.8 kg)   SpO2 97%   BMI 31.93 kg/m    General: Alert and cooperative and appears to be in no acute distress HEENT: Neck non-tender without lymphadenopathy, masses or thyromegaly Cardio: Normal S1 and S2, no S3 or S4. Rhythm is regular. No murmurs or rubs.  Pulm: Clear to auscultation bilaterally, no crackles, wheezing, or diminished breath sounds. Normal respiratory effort Abdomen: Bowel sounds normal. Abdomen soft and non-tender.  Extremities: No peripheral edema. Warm/ well perfused.  Strong radial pulse. Anal exam: Normal tone.  No masses appreciated on DRE.  Stool samples taken.   ASSESSMENT/PLAN:   Rectal bleeding His rectal bleeding does sound consistent with a possible rectal fissure although he does deny any recent constipation.  The differential also includes hemorrhoids, friable rectal tissue related to his prostate cancer which could be bleeding.   Diverticulosis, colon cancer.  He is 69 years old and has never had a colonoscopy and needs one for age-appropriate cancer screening.  He is also due for a colonoscopy based on his episode of diverticulitis in April. -Ambulatory referral to gastroenterology for colonoscopy  Diverticulitis He completed his antibiotic course and reports that her symptoms have entirely resolved. -Follow-up BMP to assess kidney function (mildly elevated in the ED during his episode of diverticulitis) -Follow-up colonoscopy     Tyrone Haymaker, MD [place]

## 2019-09-10 LAB — BASIC METABOLIC PANEL
BUN/Creatinine Ratio: 12 (ref 10–24)
BUN: 14 mg/dL (ref 8–27)
CO2: 21 mmol/L (ref 20–29)
Calcium: 9.4 mg/dL (ref 8.6–10.2)
Chloride: 108 mmol/L — ABNORMAL HIGH (ref 96–106)
Creatinine, Ser: 1.18 mg/dL (ref 0.76–1.27)
GFR calc Af Amer: 71 mL/min/{1.73_m2} (ref >59)
GFR calc non Af Amer: 62 mL/min/{1.73_m2} (ref >59)
Glucose: 155 mg/dL — ABNORMAL HIGH (ref 65–99)
Potassium: 3.9 mmol/L (ref 3.5–5.2)
Sodium: 144 mmol/L (ref 134–144)

## 2019-09-11 ENCOUNTER — Encounter: Payer: Self-pay | Admitting: Family Medicine

## 2019-11-12 ENCOUNTER — Other Ambulatory Visit: Payer: Self-pay

## 2019-11-12 ENCOUNTER — Encounter: Payer: Self-pay | Admitting: Gastroenterology

## 2019-11-12 ENCOUNTER — Ambulatory Visit (INDEPENDENT_AMBULATORY_CARE_PROVIDER_SITE_OTHER): Payer: Medicare HMO | Admitting: Family Medicine

## 2019-11-12 VITALS — BP 122/72 | HR 90 | Ht 73.0 in

## 2019-11-12 DIAGNOSIS — K5790 Diverticulosis of intestine, part unspecified, without perforation or abscess without bleeding: Secondary | ICD-10-CM

## 2019-11-12 NOTE — Patient Instructions (Signed)
Today you were seen for stomach pain that has since resolved.   You will need to follow up with the gastroenterologist for a colonoscopy.   This may be linked to your rectal bleeding as well as suspected diverticulitis.   Please call [facility] [phone] to make an appointment as soon as possible.   Please call the clinic at [phone] if your symptoms worsen or you have any concerns. It was our pleasure to serve you.  Dr. Janus Molder

## 2019-11-12 NOTE — Progress Notes (Signed)
    SUBJECTIVE:   CHIEF COMPLAINT / HPI:   Tyrone Kline is a 43 M who presents to [facility] clinic with his wife for the issue below.   Stomach pain Resolved. Had lower stomach pain yesterday. Resolved on its own. Ate fish. Has a history of constipation and rectal bleeding. Since switched to a vegetable diet. Last episode of rectal bleeding was 3 days ago. No blood in toilet bowel but occurred with wiping. No dizziness, chest pain, shortness of breath.   PERTINENT  PMH / PSH: Diverticulitis (per Dr. Pilar Plate note completed antibiotic course with resolution; needs colonoscopy)  OBJECTIVE:   BP 122/72   Pulse 90   Ht 6\' 1"  (1.854 m)   SpO2 94%   BMI 31.93 kg/m   General: Appears well, no acute distress. Age appropriate. Cardiac: RRR, normal heart sounds, no murmurs Respiratory: CTAB, normal effort Abdomen: soft, nontender, nondistended, +bowel sounds, no guarding, no masses.   ASSESSMENT/PLAN:   Diverticulosis Chronic. Afebrile.Stomach pain has resolved. Lower stomach pain, constipation, and rectal bleeding is high suggestive of continued diverticulosis/diverticulitis. Patient referred for colonoscopy and appears that office has attempted to call several times. Patient given office number during this encounter and encouraged to call to make an appointment as soon as possible for further evaluation.  -Needs colonoscopy -Return precautions given   Gerlene Fee, [place]

## 2019-11-16 NOTE — Assessment & Plan Note (Signed)
Chronic. Afebrile.Stomach pain has resolved. Lower stomach pain, constipation, and rectal bleeding is high suggestive of continued diverticulosis/diverticulitis. Patient referred for colonoscopy and appears that office has attempted to call several times. Patient given office number during this encounter and encouraged to call to make an appointment as soon as possible for further evaluation.  -Needs colonoscopy -Return precautions given

## 2019-11-21 DIAGNOSIS — N5201 Erectile dysfunction due to arterial insufficiency: Secondary | ICD-10-CM | POA: Diagnosis not present

## 2019-12-09 ENCOUNTER — Telehealth: Payer: Self-pay | Admitting: *Deleted

## 2019-12-09 NOTE — Telephone Encounter (Signed)
I recommend an office visit first. Thank you.

## 2019-12-09 NOTE — Telephone Encounter (Signed)
Dr. Tarri Glenn, This patient was seen by his primary for abdominal pain and rectal bleeding.  He is suspected to have diverticulitis that is resolved per CT. He has been referred for a colonoscopy.  This would be his first colonoscopy.  Is he ok for direct or would you like for him to be seen in the office first?  Thanks,  Lanelle Bal

## 2019-12-09 NOTE — Telephone Encounter (Signed)
LM on pts VM to call back to schedule appointment.

## 2019-12-11 NOTE — Telephone Encounter (Signed)
OV 12-6 at 1010 am Dr Tarri Glenn

## 2020-01-05 ENCOUNTER — Encounter: Payer: Medicare HMO | Admitting: Gastroenterology

## 2020-01-15 DIAGNOSIS — Z20822 Contact with and (suspected) exposure to covid-19: Secondary | ICD-10-CM | POA: Diagnosis not present

## 2020-01-28 NOTE — Telephone Encounter (Signed)
UPDATE:  Was able to locate previous colonoscopy w/ pathology within [place], [place] 12/26/02 with Dr. Collene Mares. Records have been printed for your review.

## 2020-02-01 NOTE — Telephone Encounter (Signed)
Thank you :)

## 2020-02-09 ENCOUNTER — Ambulatory Visit: Payer: Medicare HMO | Admitting: Gastroenterology

## 2020-02-09 ENCOUNTER — Encounter: Payer: Self-pay | Admitting: Gastroenterology

## 2020-02-09 VITALS — BP 146/92 | HR 88 | Ht 72.44 in | Wt 237.0 lb

## 2020-02-09 DIAGNOSIS — K625 Hemorrhage of anus and rectum: Secondary | ICD-10-CM | POA: Diagnosis not present

## 2020-02-09 DIAGNOSIS — K5792 Diverticulitis of intestine, part unspecified, without perforation or abscess without bleeding: Secondary | ICD-10-CM | POA: Diagnosis not present

## 2020-02-09 MED ORDER — SUPREP BOWEL PREP KIT 17.5-3.13-1.6 GM/177ML PO SOLN: 1.0000 | ORAL | 0 refills | Status: DC

## 2020-02-09 NOTE — Patient Instructions (Addendum)
I have recommended a colonoscopy for further evaluation of your recent abdominal pain and bleeding, as well as to follow-up on your diverticulitis.   Tips for colonoscopy:  - Stay well hydrated for 3-4 days prior to the exam. This reduces nausea and dehydration.  - To prevent skin/hemorrhoid irritation - prior to wiping, put A&Dointment or vaseline on the toilet paper. - Keep a towel or pad on the bed.  - Drink  64oz of clear liquids in the morning of prep day (prior to starting the prep) to be sure that there is enough fluid to flush the colon and stay hydrated!!!! This is in addition to the fluids required for preparation. - Use of a flavored hard candy, such as grape Anise Salvo, can counteract some of the flavor of the prep and may prevent some nausea.   COLONOSCOPY: You have been scheduled for a colonoscopy. Please follow written instructions given to you at your visit today.   PREP: Please pick up your prep supplies at the pharmacy within the next 1-3 days.  INHALERS: If you use inhalers (even only as needed), please bring them with you on the day of your procedure.  If you are age 71 or older, your body mass index should be between 23-30. Your Body mass index is 31.75 kg/m. If this is out of the aforementioned range listed, please consider follow up with your Primary Care Provider.  Thank you for trusting me with your gastrointestinal care!    Thornton Park, MD, MPH

## 2020-02-09 NOTE — Progress Notes (Signed)
Referring Provider: Wilber Oliphant, MD Primary Care Physician:  Wilber Oliphant, MD  Reason for Consultation:  Acute diverticulitis   IMPRESSION:  Recent diverticulitis Abdominal pain and rectal bleeding, now resolved History of colon polyps    - multiple tubular adenomas on colonoscopy with Dr. Collene Mares 2004  Recent evaluation for abdominal pain and rectal bleeding revealed CT diagnosed descending colon diverticulitis.  Symptoms have resolved.  Colonoscopy recommended to follow-up on the diverticulitis as well as surveillance.  He had multiple tubular adenomas on a colonoscopy in 2004 with no surveillance since that time.  PLAN: Colonoscopy  The nature of the procedure, as well as the risks, benefits, and alternatives were carefully and thoroughly reviewed with the patient. Ample time for discussion and questions allowed. The patient understood, was satisfied, and agreed to proceed.  Please see the "Patient Instructions" section for addition details about the plan.  HPI: Tyrone Kline is a 69 y.o. male referred by Dr. Maudie Mercury for further evaluation of abdominal pain and rectal bleeding. The history is obtained to the patient, review of his electronic health record, and review of his prior colonoscopy obtained from Dr. Collene Mares.  He was referred for abdominal pain, rectal pain, and red blood on the toilet paper.  He had CT diagnosed descending colon diverticulitis 06/2019. He was treated with antibiotics with resolution of his symptoms. He has been referred for a colonoscopy.    Having one formed bowel movement daily.  There is no ongoing pain or bleeding.  Screening colonoscopy with Dr. Collene Mares 2004 with removal of multiple right sided colon tubular adenomas and sigmoid diverticulosis.    Family history is unknown.   Most recent labs from July 2021 show a hemoglobin of 15, FOBT +.  I have personally reviewed the images from his CT of the abdomen and pelvis 06/20/2019 showing acute  diverticulitis involving approximately 7 cm of the mid descending colon, diverticulosis in the descending and sigmoid colon, renal cyst, and small liver hypodensities too small to characterize but suspected cysts.   Past Medical History:  Diagnosis Date  . Encounter to establish care 04/05/2017  . Prostate cancer ([place])   . Sleep apnea 10/06/11   STOP BANG SCORE OF 4    Past Surgical History:  Procedure Laterality Date  . ROBOT ASSISTED LAPAROSCOPIC RADICAL PROSTATECTOMY  10/11/2011   Procedure: ROBOTIC ASSISTED LAPAROSCOPIC RADICAL PROSTATECTOMY;  Surgeon: Bernestine Amass, MD;  Location: [facility];  Service: Urology;  Laterality: N/A;  Robotic Assisted Laparoscopic Radical Retropubic Prostatectomy with Bilateral Pelvic Lymph Node Dissection    . SURGERY ON RIGHT INDEX FINGER      Current Outpatient Medications  Medication Sig Dispense Refill  . polyethylene glycol powder (GLYCOLAX/MIRALAX) 17 GM/SCOOP powder Take 17 g by mouth 2 (two) times daily as needed. 3350 g 1  . sildenafil (VIAGRA) 25 MG tablet Take 1 tablet (25 mg total) by mouth daily as needed for erectile dysfunction. 10 tablet 0   No current facility-administered medications for this visit.    Allergies as of 02/09/2020  . (No Known Allergies)    Family History  Family history unknown: Yes    Social History   Socioeconomic History  . Marital status: Single    Spouse name: Not on file  . Number of children: 0  . Years of education: 70  . Highest education level: Not on file  Occupational History  . Occupation: retired    Fish farm manager: UNEMPLOYED  . Occupation: Retired   Tobacco Use  .  Smoking status: Never Smoker  . Smokeless tobacco: Never Used  Vaping Use  . Vaping Use: Never used  Substance and Sexual Activity  . Alcohol use: No  . Drug use: No  . Sexual activity: Not on file  Other Topics Concern  . Not on file  Social History Narrative   Patient lives at home with his wife Shirlee Limerick.   Patient is retired.      Patient has 2 adopted children.    Patient has a Copywriter, advertising.        Social Determinants of Health   Financial Resource Strain:   . Difficulty of Paying Living Expenses: Not on file  Food Insecurity:   . Worried About Charity fundraiser in the Last Year: Not on file  . Ran Out of Food in the Last Year: Not on file  Transportation Needs:   . Lack of Transportation (Medical): Not on file  . Lack of Transportation (Non-Medical): Not on file  Physical Activity:   . Days of Exercise per Week: Not on file  . Minutes of Exercise per Session: Not on file  Stress:   . Feeling of Stress : Not on file  Social Connections:   . Frequency of Communication with Friends and Family: Not on file  . Frequency of Social Gatherings with Friends and Family: Not on file  . Attends Religious Services: Not on file  . Active Member of Clubs or Organizations: Not on file  . Attends Archivist Meetings: Not on file  . Marital Status: Not on file  Intimate Partner Violence:   . Fear of Current or Ex-Partner: Not on file  . Emotionally Abused: Not on file  . Physically Abused: Not on file  . Sexually Abused: Not on file    Review of Systems: 12 system ROS is negative except as noted above.   Physical Exam: General:   Alert,  well-nourished, pleasant and cooperative in NAD Head:  Normocephalic and atraumatic. Eyes:  Sclera clear, no icterus.   Conjunctiva pink. Ears:  Normal auditory acuity. Nose:  No deformity, discharge,  or lesions. Mouth:  No deformity or lesions.   Neck:  Supple; no masses or thyromegaly. Lungs:  Clear throughout to auscultation.   No wheezes. Heart:  Regular rate and rhythm; no murmurs. Abdomen:  Soft,nontender, nondistended, normal bowel sounds, no rebound or guarding. No hepatosplenomegaly.   Rectal:  Deferred  Msk:  Symmetrical. No boney deformities LAD: No inguinal or umbilical LAD Extremities:  No clubbing or edema. Neurologic:  Alert and   oriented x4;  grossly nonfocal Skin:  Intact without significant lesions or rashes. Psych:  Alert and cooperative. Normal mood and affect.    Damont Balles L. Tarri Glenn, MD, MPH 02/09/2020, 10:28 AM

## 2020-02-16 ENCOUNTER — Telehealth: Payer: Self-pay | Admitting: Gastroenterology

## 2020-02-16 NOTE — Telephone Encounter (Signed)
Patients wife called requesting that the prep medication be changed to generic please advise

## 2020-02-16 NOTE — Telephone Encounter (Signed)
Called pt to inform about Dr. Tarri Glenn response below. LVM requesting returned call.

## 2020-02-16 NOTE — Telephone Encounter (Signed)
Yes. May substitute with the bowel prep that is most cost effective for him.

## 2020-02-16 NOTE — Telephone Encounter (Signed)
It is my understanding there isn't a generic for Suprep. Would you like to change to Miralax prep so they can purchase products OTC?

## 2020-02-17 NOTE — Telephone Encounter (Signed)
FINAL ATTEMPT:  Called pt to inform about Dr. Payton Emerald response below. Again LVM requesting returned call.

## 2020-02-17 NOTE — Telephone Encounter (Signed)
SECOND ATTEMPT:  Called pt to inform about Dr. Tarri Glenn response below. LVM requesting returned call.

## 2020-02-18 NOTE — Telephone Encounter (Signed)
Patient's wife called back in regards to medication.  Please call her [phone].

## 2020-02-18 NOTE — Telephone Encounter (Signed)
Called and left detailed message for pt and his "wife" that we can change the prep to a more affordable option but he will need to come by the office to pick up new instructions. New instructions printed for Miralax split dose.  On CMA, [place] desk.

## 2020-03-18 ENCOUNTER — Encounter: Payer: Self-pay | Admitting: Gastroenterology

## 2020-03-19 ENCOUNTER — Other Ambulatory Visit: Payer: Self-pay

## 2020-03-19 ENCOUNTER — Ambulatory Visit (INDEPENDENT_AMBULATORY_CARE_PROVIDER_SITE_OTHER): Payer: Medicare HMO

## 2020-03-19 ENCOUNTER — Telehealth: Payer: Self-pay | Admitting: *Deleted

## 2020-03-19 DIAGNOSIS — Z23 Encounter for immunization: Secondary | ICD-10-CM

## 2020-03-19 NOTE — Progress Notes (Signed)
Patient presents to nurse clinic for flu vaccination. Administered in LD, site unremarkable, tolerated injection well.   Jazelle Achey C Jiyan Walkowski, RN   

## 2020-03-19 NOTE — Telephone Encounter (Signed)
New instructions mailed to pt. 

## 2020-03-22 ENCOUNTER — Encounter: Payer: Medicare HMO | Admitting: Gastroenterology

## 2020-04-26 ENCOUNTER — Encounter: Payer: Self-pay | Admitting: Gastroenterology

## 2020-04-26 ENCOUNTER — Other Ambulatory Visit: Payer: Self-pay

## 2020-04-26 ENCOUNTER — Ambulatory Visit (AMBULATORY_SURGERY_CENTER): Payer: Medicare HMO | Admitting: Gastroenterology

## 2020-04-26 VITALS — BP 121/70 | HR 72 | Temp 97.3°F | Resp 18 | Ht 72.0 in | Wt 237.0 lb

## 2020-04-26 DIAGNOSIS — K625 Hemorrhage of anus and rectum: Secondary | ICD-10-CM | POA: Diagnosis not present

## 2020-04-26 DIAGNOSIS — K5730 Diverticulosis of large intestine without perforation or abscess without bleeding: Secondary | ICD-10-CM | POA: Diagnosis not present

## 2020-04-26 DIAGNOSIS — D122 Benign neoplasm of ascending colon: Secondary | ICD-10-CM | POA: Diagnosis not present

## 2020-04-26 DIAGNOSIS — K648 Other hemorrhoids: Secondary | ICD-10-CM | POA: Diagnosis not present

## 2020-04-26 DIAGNOSIS — D123 Benign neoplasm of transverse colon: Secondary | ICD-10-CM | POA: Diagnosis not present

## 2020-04-26 DIAGNOSIS — G4733 Obstructive sleep apnea (adult) (pediatric): Secondary | ICD-10-CM | POA: Diagnosis not present

## 2020-04-26 MED ORDER — SODIUM CHLORIDE 0.9 % IV SOLN: 500.0000 mL | Freq: Once | INTRAVENOUS | Status: DC

## 2020-04-26 NOTE — Op Note (Signed)
[facility] Patient Name: Tyrone Kline Procedure Date: 04/26/2020 1:03 PM MRN: [id number] Endoscopist: Thornton Park MD, MD Age: 70 Referring MD:  Date of Birth: 1950-08-13 Gender: Male Account #: [id number] Procedure:                Colonoscopy Indications:              Follow-up of diverticulitis                           Abdominal pain and rectal bleeding, now resolved                           History of colon polyps                           - multiple tubular adenomas on colonoscopy with Dr.                            Collene Mares 2004 Medicines:                Monitored Anesthesia Care Procedure:                Pre-Anesthesia Assessment:                           - Prior to the procedure, a History and Physical                            was performed, and patient medications and                            allergies were reviewed. The patient's tolerance of                            previous anesthesia was also reviewed. The risks                            and benefits of the procedure and the sedation                            options and risks were discussed with the patient.                            All questions were answered, and informed consent                            was obtained. Prior Anticoagulants: The patient has                            taken no previous anticoagulant or antiplatelet                            agents. ASA Grade Assessment: II - A patient with  mild systemic disease. After reviewing the risks                            and benefits, the patient was deemed in                            satisfactory condition to undergo the procedure.                           After obtaining informed consent, the colonoscope                            was passed under direct vision. Throughout the                            procedure, the patient's blood pressure, pulse, and                            oxygen saturations  were monitored continuously. The                            Colonoscope was introduced through the anus and                            advanced to the 3 cm into the ileum. A second                            forward view of the right colon was performed. The                            colonoscopy was performed without difficulty. The                            patient tolerated the procedure well. The quality                            of the bowel preparation was good although                            extensive lavage of the right colon had to be                            performed. . The terminal ileum, ileocecal valve,                            appendiceal orifice, and rectum were photographed. Scope In: 1:33:08 PM Scope Out: 1:51:40 PM Scope Withdrawal Time: 0 hours 15 minutes 57 seconds  Total Procedure Duration: 0 hours 18 minutes 32 seconds  Findings:                 The perianal and digital rectal examinations were                            normal.  Non-bleeding internal hemorrhoids were found.                           Multiple small and large-mouthed diverticula were                            found in the entire colon. The diverticulosis is                            most dense in the left colon.                           Two sessile polyps were found in the splenic                            flexure and ascending colon. The polyps were 1 to 2                            mm in size. These polyps were removed with a cold                            snare. Resection and retrieval were complete.                            Estimated blood loss was minimal.                           The exam was otherwise without abnormality on                            direct and retroflexion views. Complications:            No immediate complications. Estimated blood loss:                            Minimal. Estimated Blood Loss:     Estimated blood loss was  minimal. Impression:               - Non-bleeding internal hemorrhoids.                           - Diverticulosis in the entire examined colon.                           - Two 1 to 2 mm polyps at the splenic flexure and                            in the ascending colon, removed with a cold snare.                            Resected and retrieved.                           - The examination was otherwise normal on direct  and retroflexion views. Recommendation:           - Patient has a contact number available for                            emergencies. The signs and symptoms of potential                            delayed complications were discussed with the                            patient. Return to normal activities tomorrow.                            Written discharge instructions were provided to the                            patient.                           - Follow a high fiber diet. Drink at least 64                            ounces of water daily. Add a daily stool bulking                            agent such as psyllium (an exampled would be                            Metamucil).                           - Continue present medications.                           - Await pathology results.                           - Repeat colonoscopy date to be determined after                            pending pathology results are reviewed for                            surveillance.                           - Emerging evidence supports eating a diet of                            fruits, vegetables, grains, calcium, and yogurt                            while reducing red meat and alcohol may reduce the  risk of colon cancer.                           - Thank you for allowing me to be involved in your                            colon cancer prevention. Thornton Park MD, MD 04/26/2020 2:00:46 PM This report has been signed  electronically.

## 2020-04-26 NOTE — Patient Instructions (Signed)
Information on polyps, hemorrhoids and diverticulosis given to you today.  Await pathology results.  Add a stool bulking agent to your diet like metamucil.  Resume previous diet and medications.  YOU HAD AN ENDOSCOPIC PROCEDURE TODAY AT [facility]:   Refer to the procedure report that was given to you for any specific questions about what was found during the examination.  If the procedure report does not answer your questions, please call your gastroenterologist to clarify.  If you requested that your care partner not be given the details of your procedure findings, then the procedure report has been included in a sealed envelope for you to review at your convenience later.  YOU SHOULD EXPECT: Some feelings of bloating in the abdomen. Passage of more gas than usual.  Walking can help get rid of the air that was put into your GI tract during the procedure and reduce the bloating. If you had a lower endoscopy (such as a colonoscopy or flexible sigmoidoscopy) you may notice spotting of blood in your stool or on the toilet paper. If you underwent a bowel prep for your procedure, you may not have a normal bowel movement for a few days.  Please Note:  You might notice some irritation and congestion in your nose or some drainage.  This is from the oxygen used during your procedure.  There is no need for concern and it should clear up in a day or so.  SYMPTOMS TO REPORT IMMEDIATELY:   Following lower endoscopy (colonoscopy or flexible sigmoidoscopy):  Excessive amounts of blood in the stool  Significant tenderness or worsening of abdominal pains  Swelling of the abdomen that is new, acute  Fever of 100F or higher  For urgent or emergent issues, a gastroenterologist can be reached at any hour by calling [phone]. Do not use MyChart messaging for urgent concerns.    DIET:  We do recommend a small meal at first, but then you may proceed to your regular diet.  Drink plenty of  fluids but you should avoid alcoholic beverages for 24 hours.  ACTIVITY:  You should plan to take it easy for the rest of today and you should NOT DRIVE or use heavy machinery until tomorrow (because of the sedation medicines used during the test).    FOLLOW UP: Our staff will call the number listed on your records 48-72 hours following your procedure to check on you and address any questions or concerns that you may have regarding the information given to you following your procedure. If we do not reach you, we will leave a message.  We will attempt to reach you two times.  During this call, we will ask if you have developed any symptoms of COVID 19. If you develop any symptoms (ie: fever, flu-like symptoms, shortness of breath, cough etc.) before then, please call [phone].  If you test positive for Covid 19 in the 2 weeks post procedure, please call and report this information to [place].    If any biopsies were taken you will be contacted by phone or by letter within the next 1-3 weeks.  Please call us at [phone] if you have not heard about the biopsies in 3 weeks.    SIGNATURES/CONFIDENTIALITY: You and/or your care partner have signed paperwork which will be entered into your electronic medical record.  These signatures attest to the fact that that the information above on your After Visit Summary has been reviewed and is understood.  Full responsibility  of the confidentiality of this discharge information lies with you and/or your care-partner.

## 2020-04-26 NOTE — Progress Notes (Signed)
Called to room to assist during endoscopic procedure.  Patient ID and intended procedure confirmed with present staff. Received instructions for my participation in the procedure from the performing physician.  

## 2020-04-26 NOTE — Progress Notes (Signed)
JB - Check-in  NS - VS

## 2020-04-26 NOTE — Progress Notes (Signed)
Report to PACU, RN, vss, BBS= Clear.  

## 2020-04-28 ENCOUNTER — Telehealth: Payer: Self-pay | Admitting: *Deleted

## 2020-04-28 NOTE — Telephone Encounter (Signed)
  Follow up Call-  Call back number 04/26/2020  Post procedure Call Back phone  # [phone] cell  Permission to leave phone message Yes  Some recent data might be hidden     Patient questions:  Do you have a fever, pain , or abdominal swelling? No. Pain Score  0 *  Have you tolerated food without any problems? Yes.    Have you been able to return to your normal activities? Yes.    Do you have any questions about your discharge instructions: Diet   No. Medications  No. Follow up visit  No.  Do you have questions or concerns about your Care? No.  Actions: * If pain score is 4 or above: No action needed, pain <4.  1. Have you developed a fever since your procedure? no  2.   Have you had an respiratory symptoms (SOB or cough) since your procedure? no  3.   Have you tested positive for COVID 19 since your procedure no  4.   Have you had any family members/close contacts diagnosed with the COVID 19 since your procedure?  no   If yes to any of these questions please route to Joylene John, RN and Joella Prince, RN

## 2020-05-04 ENCOUNTER — Encounter: Payer: Self-pay | Admitting: Gastroenterology

## 2020-07-14 ENCOUNTER — Ambulatory Visit: Payer: Medicare HMO

## 2020-10-09 DIAGNOSIS — Z20822 Contact with and (suspected) exposure to covid-19: Secondary | ICD-10-CM | POA: Diagnosis not present

## 2020-12-30 DIAGNOSIS — Z20822 Contact with and (suspected) exposure to covid-19: Secondary | ICD-10-CM | POA: Diagnosis not present

## 2021-04-28 ENCOUNTER — Ambulatory Visit: Payer: Medicare HMO

## 2021-04-28 ENCOUNTER — Ambulatory Visit (INDEPENDENT_AMBULATORY_CARE_PROVIDER_SITE_OTHER): Payer: Self-pay

## 2021-04-28 ENCOUNTER — Other Ambulatory Visit: Payer: Self-pay

## 2021-04-28 DIAGNOSIS — Z23 Encounter for immunization: Secondary | ICD-10-CM

## 2021-04-28 NOTE — Progress Notes (Signed)
Patient presents to nurse clinic for annual flu vaccination. Administered in LD, site unremarkable, tolerated injection well.   Talbot Grumbling, RN

## 2021-06-26 IMAGING — CT CT ABD-PELV W/ CM
2 of 5 series · 16 of 46 positions shown, 18 images · IV contrast (Omni 300)
Comparison: None.

CLINICAL DATA: Abdominal pain since yesterday. Suspect
infection/abscess.

EXAM:
CT ABDOMEN AND PELVIS WITH CONTRAST
TECHNIQUE: Multidetector CT imaging of the abdomen and pelvis was performed
using the standard protocol following bolus administration of
intravenous contrast.
CONTRAST:  100mL OMNIPAQUE IOHEXOL 300 MG/ML  SOLN

[Series 3: a/p w/ 5mm · axial · 0.78mm/px · z∈[+888,+1343]mm · 13 of 101 slices shown, 15 images]
[im 5/101  soft-tissue]
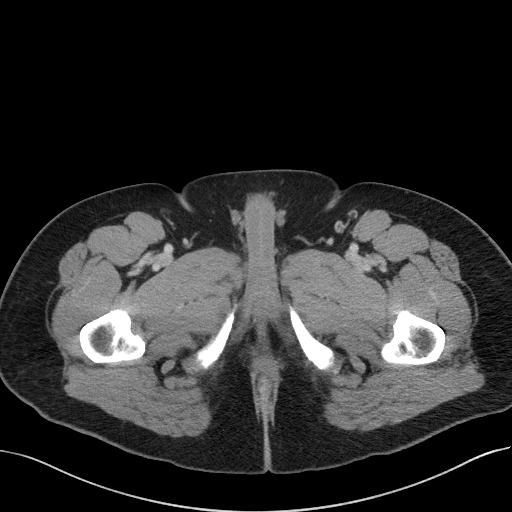
[im 5/101  bone]
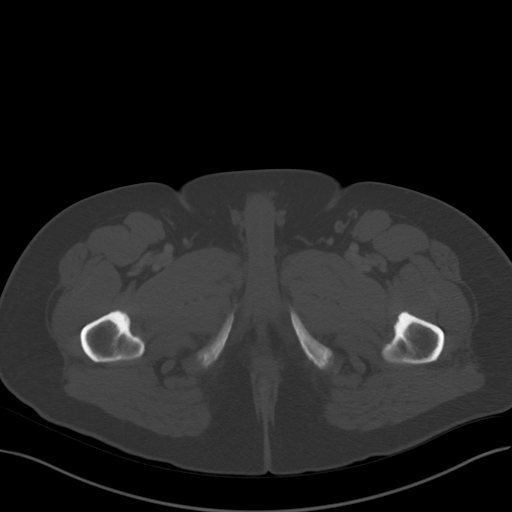
[im 15/101  soft-tissue]
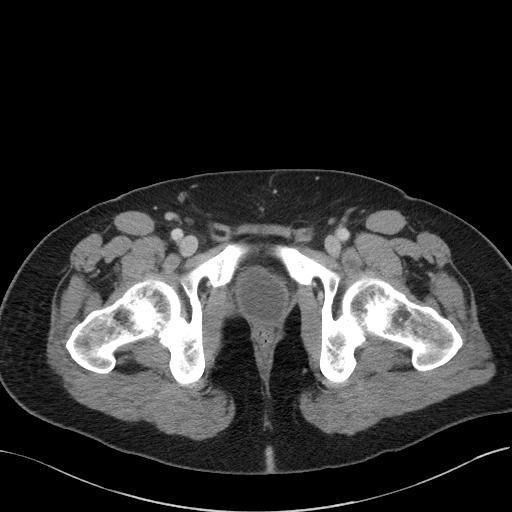
[im 20/101  soft-tissue]
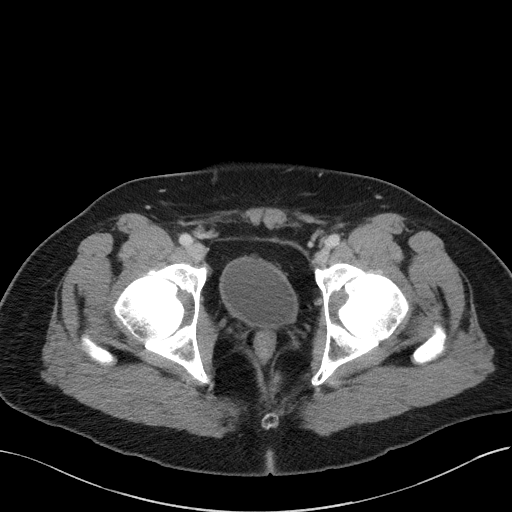
[im 29/101  soft-tissue]
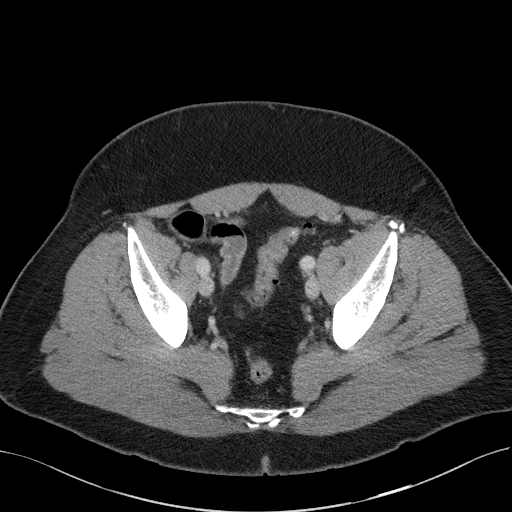
[im 34/101  soft-tissue]
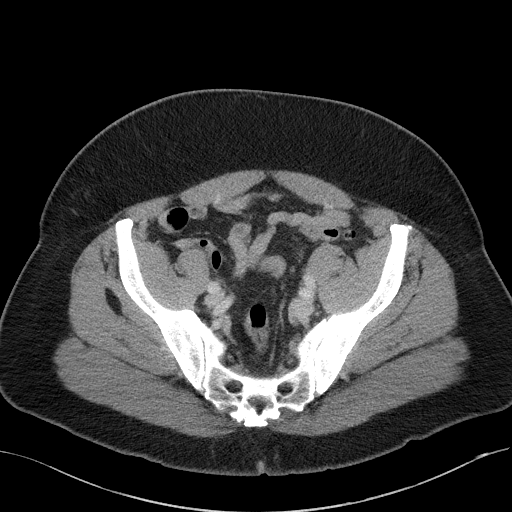
[im 43/101  soft-tissue]
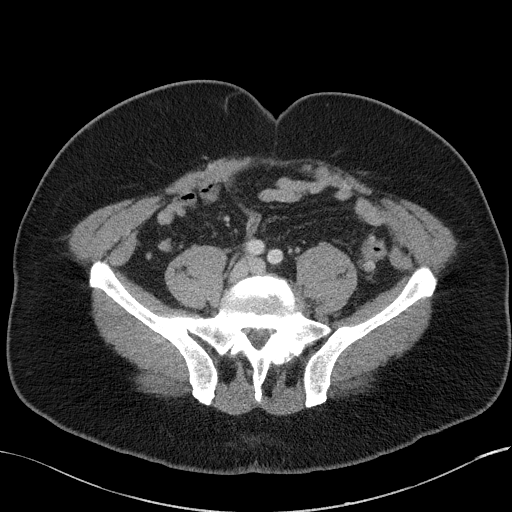
[im 53/101  soft-tissue]
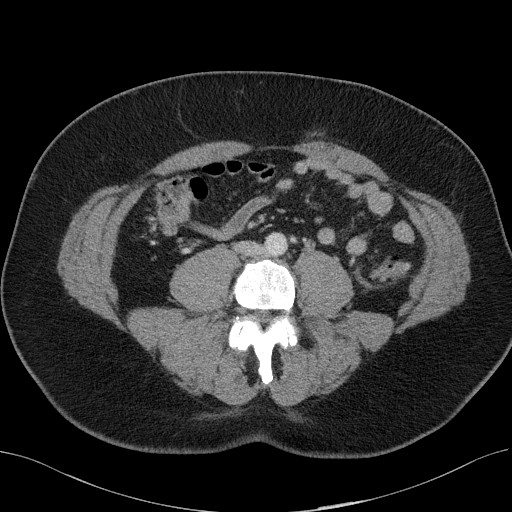
[im 58/101  soft-tissue]
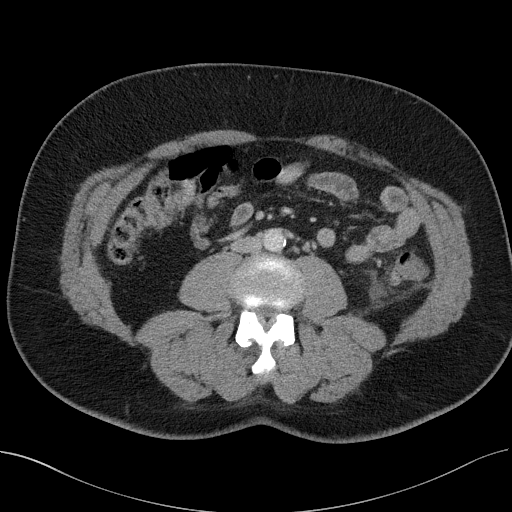
[im 67/101  soft-tissue]
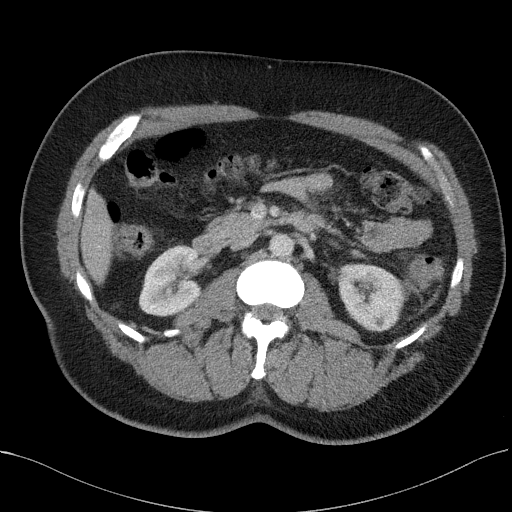
[im 67/101  bone]
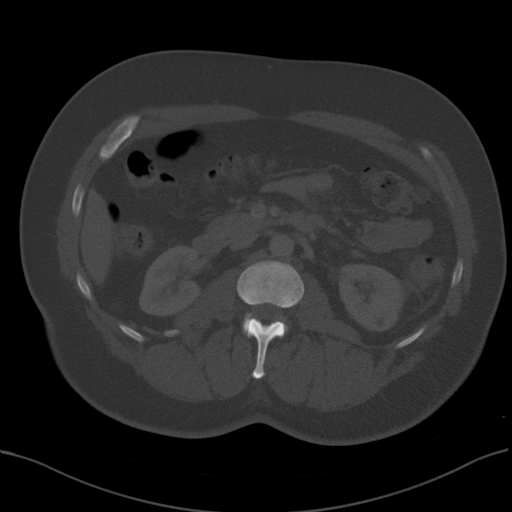
[im 72/101  soft-tissue]
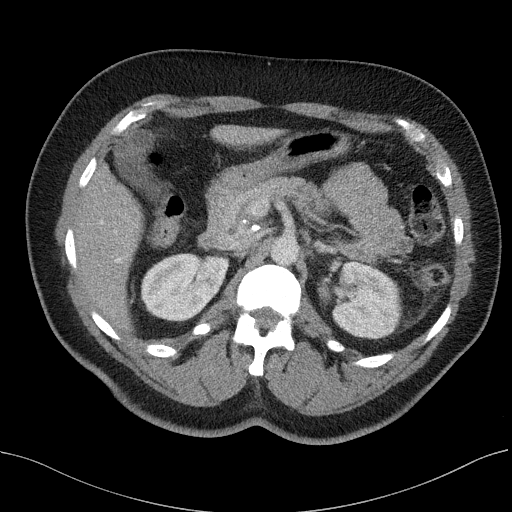
[im 81/101  soft-tissue]
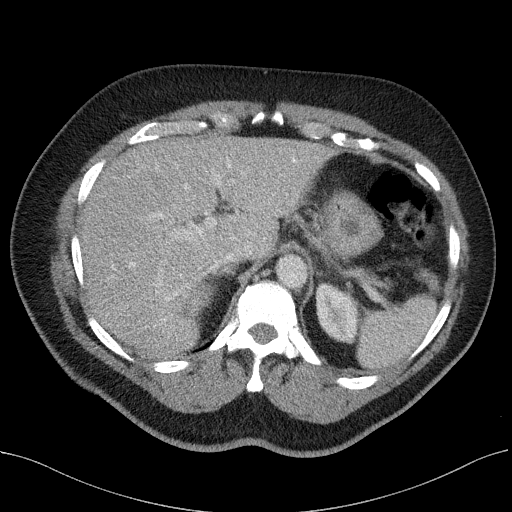
[im 86/101  soft-tissue]
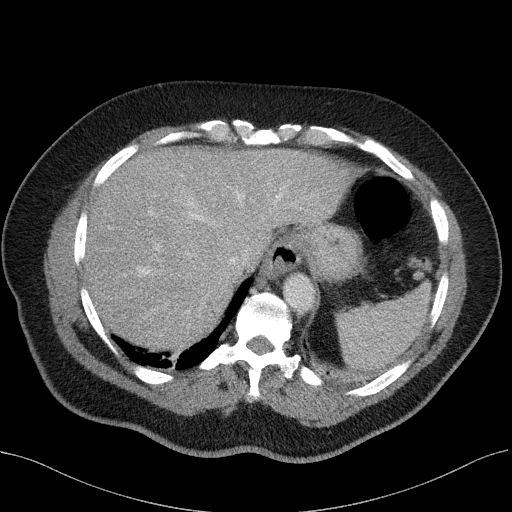
[im 96/101  soft-tissue]
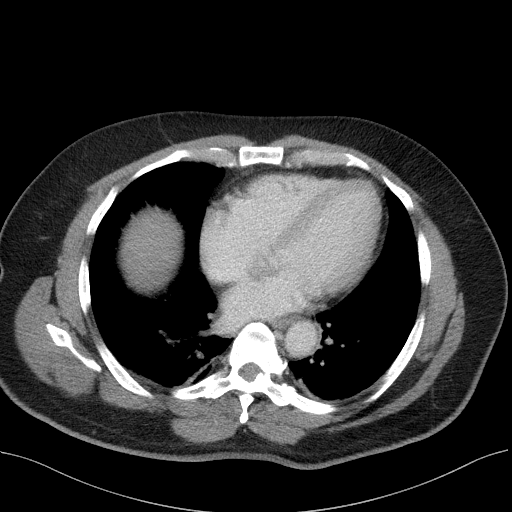

[Series 6: a/p w/ cor · coronal · 0.77mm/px · 3 of 151 slices shown]
[im 51/151  soft-tissue]
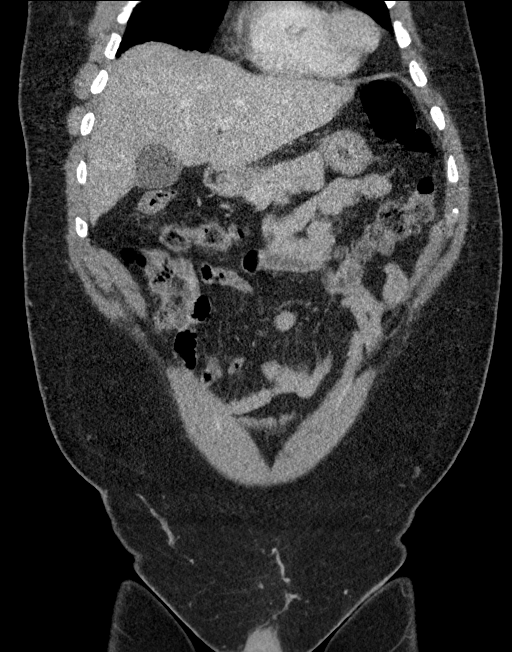
[im 67/151  soft-tissue]
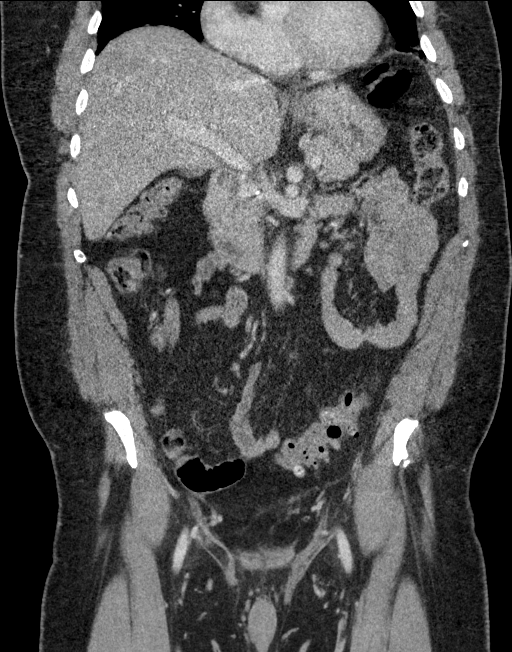
[im 84/151  soft-tissue]
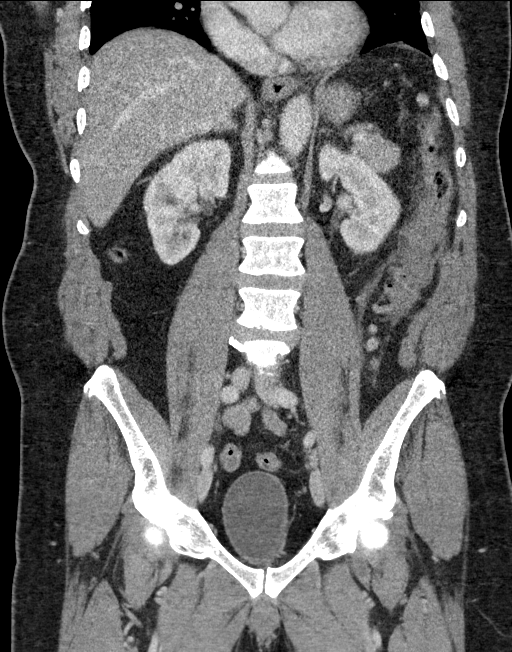

[16 of 46 positions shown; findings below may reference images not displayed]

FINDINGS: Lower chest: Minimal posterior bibasilar atelectasis. Borderline
cardiomegaly.

Hepatobiliary: Mild low-attenuation of the liver. Couple small
subcentimeter hypodensities too small to characterize but likely
cysts. Gallbladder and biliary tree are normal.

Pancreas: There is a coarse 1.2 cm linear calcification along the
medial aspect of the pancreatic head as the pancreas is otherwise
unremarkable.

Spleen: Normal.

Adrenals/Urinary Tract: Adrenal glands are normal. Kidneys are
normal in size with 1.2 cm cyst over the upper pole. Several small
bilateral subcentimeter renal cortical hypodensities too small to
characterize but likely cysts. Ureters and bladder are normal.

Stomach/Bowel: Stomach and small bowel are within normal. Appendix
is normal.

There is diverticulosis over the descending and sigmoid colon. There
is an approximate 7 cm segment of the mid descending colon
demonstrating an inflamed diverticula with adjacent pericolonic
inflammation and minimal free fluid. Findings are compatible with
acute diverticulitis. Mild associated wall thickening in this area.
No adenopathy. No perforation or diverticular abscess.

Vascular/Lymphatic: Very minimal calcified plaque over the distal
abdominal aorta. Aorta is normal in caliber. No evidence of
adenopathy.

Reproductive: Previous prostatectomy.

Other: None.

Musculoskeletal: Unremarkable.
IMPRESSION: 1. Evidence of acute diverticulitis involving an approximate 7 cm
segment of mid descending colon. No evidence of perforation or
abscess.

2. Two subcentimeter liver hypodensities too small to characterize
but likely cysts.

3. 1.2 cm left renal cyst. Several other smaller subcentimeter
bilateral renal cortical hypodensities too small to characterize but
likely cysts.

4.  Aortic Atherosclerosis ([id number]-UXM.M).

## 2021-09-19 DIAGNOSIS — H524 Presbyopia: Secondary | ICD-10-CM | POA: Diagnosis not present

## 2022-05-24 ENCOUNTER — Ambulatory Visit (INDEPENDENT_AMBULATORY_CARE_PROVIDER_SITE_OTHER): Payer: Medicare Other

## 2022-05-24 DIAGNOSIS — Z23 Encounter for immunization: Secondary | ICD-10-CM

## 2022-05-26 NOTE — Progress Notes (Signed)
Patient presents to nurse clinic for [place] vaccination. Administered in LD, site unremarkable, tolerated injection well.   Observed for 15 minutes post injection, no signs of adverse reaction.   Talbot Grumbling, RN

## 2022-07-05 NOTE — Progress Notes (Signed)
Reviewed and agree.

## 2023-01-19 ENCOUNTER — Telehealth: Payer: Self-pay

## 2023-01-19 NOTE — Telephone Encounter (Signed)
Patient's wife calls nurse line requesting to schedule flu and covid vaccination.   Scheduled for next week.   Wife is asking if patient is due for pneumonia vaccine. He received prevnar 13 on 04/05/2017.  Please advise.   Veronda Prude, RN

## 2023-01-24 ENCOUNTER — Ambulatory Visit: Payer: Medicare Other

## 2023-01-24 DIAGNOSIS — Z23 Encounter for immunization: Secondary | ICD-10-CM

## 2023-01-24 NOTE — Progress Notes (Signed)
Patient presents with wife for Flu and Covid vaccines. Vaccines administered without complication.  See admin for details.

## 2023-01-26 NOTE — Telephone Encounter (Signed)
Called patient's wife and informed of message per Dr. Phineas Real.   She states that she will discuss this with him and call back if he would like to receive vaccination.   Veronda Prude, RN

## 2023-11-08 ENCOUNTER — Ambulatory Visit

## 2023-11-08 DIAGNOSIS — Z23 Encounter for immunization: Secondary | ICD-10-CM | POA: Diagnosis not present

## 2023-11-08 NOTE — Progress Notes (Signed)
 Patient presents to nurse clinic for flu vaccination.   Per chart review, patient has not had OV since 11/12/19.   Spoke with Dr. Rumball who gave permission for patient to receive vaccination and schedule with PCP for physical.   Scheduled with Dr. Cleotilde on 11/26/23.  Administered flu vaccine in RD, site unremarkable, tolerated injection well.   Chiquita JAYSON English, RN

## 2023-11-26 ENCOUNTER — Ambulatory Visit (INDEPENDENT_AMBULATORY_CARE_PROVIDER_SITE_OTHER): Admitting: Student

## 2023-11-26 ENCOUNTER — Encounter: Payer: Self-pay | Admitting: Student

## 2023-11-26 VITALS — BP 133/83 | HR 68 | Ht 73.0 in | Wt 213.4 lb

## 2023-11-26 DIAGNOSIS — Z0000 Encounter for general adult medical examination without abnormal findings: Secondary | ICD-10-CM | POA: Diagnosis not present

## 2023-11-26 DIAGNOSIS — Z23 Encounter for immunization: Secondary | ICD-10-CM

## 2023-11-26 DIAGNOSIS — R7303 Prediabetes: Secondary | ICD-10-CM

## 2023-11-26 DIAGNOSIS — Z125 Encounter for screening for malignant neoplasm of prostate: Secondary | ICD-10-CM | POA: Diagnosis not present

## 2023-11-26 DIAGNOSIS — H9193 Unspecified hearing loss, bilateral: Secondary | ICD-10-CM

## 2023-11-26 LAB — POCT GLYCOSYLATED HEMOGLOBIN (HGB A1C): Hemoglobin A1C: 5.6 % (ref 4.0–5.6)

## 2023-11-26 MED ORDER — ZOSTER VAC RECOMB ADJUVANTED 50 MCG/0.5ML IM SUSR: 0.5000 mL | Freq: Once | INTRAMUSCULAR | 0 refills | Status: AC

## 2023-11-26 NOTE — Progress Notes (Signed)
    SUBJECTIVE:   CHIEF COMPLAINT / HPI:   Tyrone Kline is a 73 y.o. male presenting for annual physical. He has been doing well and is not on any medications.   I reviewed previous office notes. Has not been seen since 2021  I reviewed previous lab results. Colonoscopy not due until 2029   PERTINENT  PMH / PSH: reviewed and updated.  OBJECTIVE:   BP 133/83   Pulse 68   Ht 6' 1 (1.854 m)   Wt 213 lb 6.4 oz (96.8 kg)   SpO2 99%   BMI 28.15 kg/m   Well-appearing, no acute distress Cardio: Regular rate, regular rhythm, no murmurs on exam. Pulm: Clear, no wheezing, no crackles. No increased work of breathing Abdominal: bowel sounds present, soft, non-tender, non-distended Extremities: no peripheral edema  Neuro: alert and oriented x3, speech normal in content, no facial asymmetry, strength intact and equal bilaterally in UE and LE, pupils equal and reactive to light.  Psych:  Cognition and judgment appear intact. Alert, communicative  and cooperative with normal attention span and concentration. No apparent delusions, illusions, hallucinations    ASSESSMENT/PLAN:   Assessment & Plan Encounter for adult wellness visit  Pre-diabetes Check Lipid and BMP today  A1c 5.6 Prostate cancer screening Check PSA today discussed annual screening  Hearing problem of both ears Referral to audiology placed today  Encounter for immunization Received pneumococcal vaccine today Discussed Shingles vaccine and gave prescription    Damien Pinal, DO [facility]

## 2023-11-26 NOTE — Assessment & Plan Note (Signed)
 Referral to audiology placed today

## 2023-11-26 NOTE — Patient Instructions (Addendum)
 It was great to see you today!   I recommend you get the shingles vaccine. You can get this at any available pharmacy with the printed prescription. People who get this vaccine can reduce their risk of shingles by 97%.   You are due for a Medicare Annual Wellness Visit. You will receive a phone call from our office to schedule this. This visit is conducted over the phone and is included with your insurance once yearly.    No future appointments.  Please arrive 15 minutes before your appointment to ensure smooth check in process.  If you are more than 15 minutes late, you may be asked to reschedule.   Please bring a list of your medications with you to all appointments.   Please call the clinic at [phone] if your symptoms worsen or you have any concerns.  Thank you for allowing me to participate in your care, Dr. Damien Pinal [facility]

## 2023-11-26 NOTE — Assessment & Plan Note (Signed)
 Check Lipid and BMP today  A1c 5.6

## 2023-11-27 LAB — BASIC METABOLIC PANEL WITH GFR
BUN/Creatinine Ratio: 8 — ABNORMAL LOW (ref 10–24)
BUN: 10 mg/dL (ref 8–27)
CO2: 21 mmol/L (ref 20–29)
Calcium: 9.3 mg/dL (ref 8.6–10.2)
Chloride: 107 mmol/L — ABNORMAL HIGH (ref 96–106)
Creatinine, Ser: 1.27 mg/dL (ref 0.76–1.27)
Glucose: 72 mg/dL (ref 70–99)
Potassium: 3.8 mmol/L (ref 3.5–5.2)
Sodium: 143 mmol/L (ref 134–144)
eGFR: 59 mL/min/1.73 — ABNORMAL LOW (ref >59)

## 2023-11-27 LAB — LIPID PANEL
Chol/HDL Ratio: 3.7 ratio (ref 0.0–5.0)
Cholesterol, Total: 161 mg/dL (ref 100–199)
HDL: 43 mg/dL (ref >39)
LDL Chol Calc (NIH): 106 mg/dL — ABNORMAL HIGH (ref 0–99)
Triglycerides: 60 mg/dL (ref 0–149)
VLDL Cholesterol Cal: 12 mg/dL (ref 5–40)

## 2023-11-27 LAB — PSA: Prostate Specific Ag, Serum: <0.1 ng/mL (ref 0.0–4.0)

## 2023-11-29 ENCOUNTER — Ambulatory Visit: Payer: Self-pay | Admitting: Student

## 2023-12-20 ENCOUNTER — Ambulatory Visit

## 2024-02-06 ENCOUNTER — Emergency Department (HOSPITAL_COMMUNITY)
Admission: EM | Admit: 2024-02-06 | Discharge: 2024-02-07 | Disposition: A | Attending: Emergency Medicine | Admitting: Emergency Medicine

## 2024-02-06 ENCOUNTER — Emergency Department (HOSPITAL_COMMUNITY)

## 2024-02-06 ENCOUNTER — Other Ambulatory Visit: Payer: Self-pay

## 2024-02-06 DIAGNOSIS — R42 Dizziness and giddiness: Secondary | ICD-10-CM | POA: Diagnosis not present

## 2024-02-06 DIAGNOSIS — Z8546 Personal history of malignant neoplasm of prostate: Secondary | ICD-10-CM | POA: Diagnosis not present

## 2024-02-06 DIAGNOSIS — R55 Syncope and collapse: Secondary | ICD-10-CM | POA: Diagnosis present

## 2024-02-06 DIAGNOSIS — Y92410 Unspecified street and highway as the place of occurrence of the external cause: Secondary | ICD-10-CM | POA: Insufficient documentation
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
## 2024-02-07 NOTE — Discharge Instructions (Signed)
 You were evaluated in the Emergency Department and after careful evaluation, we did not find any emergent condition requiring admission or further testing in the hospital.  Your exam/testing today is overall reassuring.  EKG and CT scan were reassuring.  Please return to the Emergency Department if you experience any worsening of your condition.   Thank you for allowing us  to be a part of your care.

## 2024-02-07 NOTE — ED Notes (Signed)
 Patient given discharge instructions. Patient verbalized understanding. Patient left with wife via private vehicle.
